# Patient Record
Sex: Male | Born: 1940 | Race: White | Hispanic: No | Marital: Married | Smoking: Former smoker
Health system: Southern US, Community
[De-identification: ages and names within clinical notes are randomized; demographics above are authoritative.]

## PROBLEM LIST (undated history)

## (undated) DIAGNOSIS — N189 Chronic kidney disease, unspecified: Secondary | ICD-10-CM

## (undated) DIAGNOSIS — I10 Essential (primary) hypertension: Secondary | ICD-10-CM

## (undated) DIAGNOSIS — E039 Hypothyroidism, unspecified: Secondary | ICD-10-CM

## (undated) DIAGNOSIS — M1990 Unspecified osteoarthritis, unspecified site: Secondary | ICD-10-CM

## (undated) DIAGNOSIS — E119 Type 2 diabetes mellitus without complications: Secondary | ICD-10-CM

## (undated) DIAGNOSIS — E785 Hyperlipidemia, unspecified: Secondary | ICD-10-CM

## (undated) DIAGNOSIS — K219 Gastro-esophageal reflux disease without esophagitis: Secondary | ICD-10-CM

## (undated) DIAGNOSIS — M1712 Unilateral primary osteoarthritis, left knee: Secondary | ICD-10-CM

## (undated) DIAGNOSIS — Z87442 Personal history of urinary calculi: Secondary | ICD-10-CM

## (undated) HISTORY — PX: COLONOSCOPY: SHX174

## (undated) HISTORY — PX: UPPER GI ENDOSCOPY: SHX6162

## (undated) HISTORY — PX: OTHER SURGICAL HISTORY: SHX169

## (undated) HISTORY — PX: JOINT REPLACEMENT: SHX530

---

## 2003-05-22 HISTORY — PX: HERNIA REPAIR: SHX51

## 2011-10-04 DIAGNOSIS — E039 Hypothyroidism, unspecified: Secondary | ICD-10-CM | POA: Insufficient documentation

## 2011-10-04 DIAGNOSIS — E119 Type 2 diabetes mellitus without complications: Secondary | ICD-10-CM | POA: Insufficient documentation

## 2011-10-04 DIAGNOSIS — I10 Essential (primary) hypertension: Secondary | ICD-10-CM | POA: Insufficient documentation

## 2011-10-04 DIAGNOSIS — N183 Chronic kidney disease, stage 3 unspecified: Secondary | ICD-10-CM | POA: Insufficient documentation

## 2013-02-23 ENCOUNTER — Ambulatory Visit: Payer: Self-pay | Admitting: Otolaryngology

## 2013-04-15 ENCOUNTER — Ambulatory Visit: Payer: Self-pay | Admitting: General Practice

## 2013-04-15 LAB — URINALYSIS, COMPLETE
Bacteria: NONE SEEN
Bilirubin,UR: NEGATIVE
Glucose,UR: 50 mg/dL (ref 0–75)
Hyaline Cast: 8
Ketone: NEGATIVE
Leukocyte Esterase: NEGATIVE
Nitrite: NEGATIVE
Ph: 6 (ref 4.5–8.0)
Protein: NEGATIVE
Squamous Epithelial: NONE SEEN
WBC UR: <1 /HPF (ref 0–5)

## 2013-04-15 LAB — CBC
HGB: 13.6 g/dL (ref 13.0–18.0)
WBC: 8.5 10*3/uL (ref 3.8–10.6)

## 2013-04-15 LAB — BASIC METABOLIC PANEL
Calcium, Total: 9.1 mg/dL (ref 8.5–10.1)
Co2: 32 mmol/L (ref 21–32)
Creatinine: 1.41 mg/dL — ABNORMAL HIGH (ref 0.60–1.30)
EGFR (African American): 57 — ABNORMAL LOW
Osmolality: 283 (ref 275–301)
Potassium: 4 mmol/L (ref 3.5–5.1)

## 2013-04-15 LAB — SEDIMENTATION RATE: Erythrocyte Sed Rate: 9 mm/hr (ref 0–20)

## 2013-04-15 LAB — PROTIME-INR: Prothrombin Time: 13.5 secs (ref 11.5–14.7)

## 2013-04-15 LAB — APTT: Activated PTT: 36 secs — ABNORMAL HIGH (ref 23.6–35.9)

## 2013-04-15 LAB — MRSA PCR SCREENING

## 2013-04-27 ENCOUNTER — Inpatient Hospital Stay: Payer: Self-pay | Admitting: General Practice

## 2013-04-27 HISTORY — PX: TOTAL KNEE ARTHROPLASTY: SHX125

## 2013-04-28 LAB — BASIC METABOLIC PANEL
Anion Gap: 2 — ABNORMAL LOW (ref 7–16)
BUN: 18 mg/dL (ref 7–18)
Co2: 28 mmol/L (ref 21–32)
Creatinine: 1.31 mg/dL — ABNORMAL HIGH (ref 0.60–1.30)
EGFR (Non-African Amer.): 54 — ABNORMAL LOW
Glucose: 120 mg/dL — ABNORMAL HIGH (ref 65–99)
Potassium: 3.9 mmol/L (ref 3.5–5.1)
Sodium: 135 mmol/L — ABNORMAL LOW (ref 136–145)

## 2013-04-28 LAB — HEMOGLOBIN: HGB: 11.8 g/dL — ABNORMAL LOW (ref 13.0–18.0)

## 2013-04-29 LAB — BASIC METABOLIC PANEL
Anion Gap: 3 — ABNORMAL LOW (ref 7–16)
BUN: 16 mg/dL (ref 7–18)
Calcium, Total: 8.5 mg/dL (ref 8.5–10.1)
Chloride: 104 mmol/L (ref 98–107)
Co2: 31 mmol/L (ref 21–32)
EGFR (African American): >60
Glucose: 120 mg/dL — ABNORMAL HIGH (ref 65–99)

## 2013-04-29 LAB — HEMOGLOBIN: HGB: 11.5 g/dL — ABNORMAL LOW (ref 13.0–18.0)

## 2013-04-29 LAB — PLATELET COUNT: Platelet: 135 10*3/uL — ABNORMAL LOW (ref 150–440)

## 2014-09-01 DIAGNOSIS — I150 Renovascular hypertension: Secondary | ICD-10-CM | POA: Insufficient documentation

## 2014-09-08 ENCOUNTER — Ambulatory Visit: Admit: 2014-09-08 | Disposition: A | Discharge status: Home / Self Care | Payer: Self-pay | Admitting: Internal Medicine

## 2014-09-10 NOTE — Discharge Summary (Signed)
PATIENT NAME:  Greg Lam, Greg Lam MR#:  [id number] DATE OF BIRTH:  Nov 08, 1940  DATE OF ADMISSION:  04/27/2013 DATE OF DISCHARGE: 04/29/2013   ADMITTING DIAGNOSIS: Degenerative arthrosis of the right knee.   DISCHARGE DIAGNOSIS: Degenerative arthrosis of the right knee.   HISTORY: The patient is a 74 year old, who has been followed at [facility] for progression of right knee pain. He reported a long history of progressive knee pain. He had been followed at [facility]  for several years and received Synvisc injections without any significant lasting improvement. He had not seen any improvement in his condition despite the use of ibuprofen. He denied any significant swelling, locking or giving way of the knee. At the time of surgery, he was having progressive difficulty with arising from a sitting position as well as ascending and descending stairs. He states that the pain had increased to the point that it was significantly interfering with his activities of daily living. X-rays taken in [place] showed significant narrowing of the medial cartilage space with associated varus alignment. He was noted to have subchondral sclerosis. After discussion of the risks and benefits of surgical intervention, the patient expressed his understanding of the risks and benefits and agreed for plans for surgical intervention.   PROCEDURE: Right total knee arthroplasty using computer-assisted navigation.   ANESTHESIA: Spinal.   SOFT TISSUE RELEASE: Anterior cruciate ligament, posterior cruciate ligament, deep medial collateral ligaments as well as the patellofemoral ligament.   IMPLANTS UTILIZED: DePuy Sigma size 5 posterior stabilized femoral component (cemented), size 5 MBT tibial component (cemented), 38 mm 3-peg oval dome patella (cemented), and a 10 mm stabilized rotating platform polyethylene insert. Gentamicin cement was utilized due to the patient's history of diabetes.   HOSPITAL  COURSE: The patient tolerated the procedure very well. He had no complications. He was then taken to the PACU where he was stabilized and then transferred to the orthopedic floor. He began receiving anticoagulation therapy of Lovenox 30 mg subcutaneous every 12 hours per anesthesia and pharmacy protocol. He was fitted with TED stockings bilaterally. These were allowed to be removed 1 hour per 8 hour shift. The right one was applied on day 2 following removal of the Hemovac and dressing change. The patient was also fitted with AVI compression foot pumps bilaterally set at 80 mmHg. His calves been nontender. There has been no evidence of any DVTs. Negative Homans sign.   The patient began receiving physical therapy on day 1 for gait training and transfers. Upon being discharged, he had been ambulating greater than 200 feet. He was able go up and down 4 sets of steps. He was independent with bed to chair transfers. Occupational therapy was also initiated on day 1 for ADLs and assistive devices. He has progressed very nicely. He has had no complications.   The patient's IV, Foley and Hemovac were discontinued on day 2along with a dressing change. Polar Care was reapplied to the surgical leg, maintaining a temperature of 40 to 50 degrees Fahrenheit.   The patient is being discharged to home in improved stable condition.   DISCHARGE INSTRUCTIONS: He may weight bear as tolerated. Continue using a walker until cleared by physical therapy to go to a quad cane. He will receive home health PT. Elevate the right lower extremity. Encouraged the patient to do heel pumps. Elevate heels off the bed. Encourage cough, deep breathing q.2 hours while awake. TED stockings are to be worn during day, but may be removed at night.  Continue using Polar Care pretty much around-the-clock for the first 2 weeks maintaining a temperature of 40 to 50 degrees Fahrenheit. He is placed on an ADA diet. He was instructed in wound care. He has a  followup appointment at [facility] on 12/23 at 9:00. He is to call the clinic sooner if any temperatures of 101.5 or greater or excessive bleeding. He is to resume his regular medications that he was on prior to admission. He was given a prescription for oxycodone 5 to 10 mg q.4 to 6 hours p.r.n. for pain as well as Tramadol 50 to 100 mg q.4 to 6 hours p.r.n. for pain and Lovenox 40 mg subcutaneously daily for 14 days, then discontinue and begin taking one 81 mg enteric-coated aspirin.   PAST MEDICAL HISTORY:  1.  Diabetes.  2.  Hiatal hernia.  3.  Hyperlipidemia.  4.  Hypertension.  5.  Hypothyroidism.  ____________________________ Vance Peper, PA jrw:aw D: 04/29/2013 13:58:23 ET T: 04/29/2013 14:18:03 ET JOB#: [id number]  cc: Vance Peper, PA, <Dictator> JON WOLFE PA ELECTRONICALLY SIGNED 05/05/2013 7:46

## 2014-09-10 NOTE — Op Note (Signed)
PATIENT NAME:  Greg Lam, Greg Lam MR#:  [id number] DATE OF BIRTH:  04-23-1941  DATE OF PROCEDURE:  04/27/2013  PREOPERATIVE DIAGNOSIS: Degenerative arthrosis of the right knee.   POSTOPERATIVE DIAGNOSIS: Degenerative arthrosis of the right knee.   PROCEDURE PERFORMED: Right total knee arthroplasty using computer-assisted navigation.   SURGEON: Skip Estimable, M.D.   ASSISTANT: Vance Peper, PA (required to maintain retraction throughout the procedure).   ANESTHESIA: Spinal.   ESTIMATED BLOOD LOSS: 50 mL.   FLUIDS REPLACED: 1200 mL of crystalloid.   TOURNIQUET TIME: 104 minutes.   DRAINS: Two medium drains to reinfusion system.   SOFT TISSUE RELEASES: Anterior cruciate ligament, posterior cruciate ligament, deep medial collateral ligament and patellofemoral ligament.   IMPLANTS UTILIZED: DePuy PFC Sigma size 5 posterior stabilized femoral component (cemented), size 5 mm MBT tibial component (cemented), 38 mm 3-peg oval dome patella (cemented), and a 10 mm stabilized rotating platform polyethylene insert. Gentamicin cement was utilized due to the patient's history of diabetes.   INDICATIONS FOR SURGERY: The patient is a 74 year old gentleman who has been seen for complaints of progressive right knee pain. X-rays demonstrated severe degenerative changes in tricompartmental fashion with varus deformity. After discussion of the risks and benefits of surgical intervention, the patient expressed understanding of the risks, benefits, and agreed with plans for surgical intervention.   PROCEDURE IN DETAIL: The patient was brought in the Operating Room and, after adequate spinal anesthesia was achieved, a tourniquet was placed on the patient's upper right thigh. The patient's right knee and leg were cleaned and prepped with alcohol and DuraPrep and draped in the usual sterile fashion. A "timeout" was performed as per usual protocol. The right lower extremity was exsanguinated using an Esmarch, and the  tourniquet was inflated to 300 mmHg. An anterior longitudinal incision was made followed by a standard mid vastus approach.   A small effusion was evacuated. The deep fibers of the medial collateral ligament were elevated in a subperiosteal fashion off the medial flare of the tibia so as to maintain a continuous soft tissue sleeve. The patella was subluxed laterally and the patellofemoral ligament was incised. Inspection of the knee demonstrated severe degenerative changes in tricompartmental fashion with full-thickness loss of articular cartilage to the medial compartment. Prominent osteophytes were debrided using a rongeur. Anterior and posterior cruciate ligaments were excised.   Two 4.0 mm Schanz pins were inserted in the femur and into the tibia for attachment of the array of trackers used for computer-assisted navigation. Hip center was identified using circumduction technique. Distal landmarks were mapped using the computer. The distal femur and proximal tibia were mapped using the computer. The distal femoral cutting guide was positioned using computer-assisted navigation so as to achieve a 5 degree distal valgus cut. Cut was performed and verified using the computer. The femoral sizing jig was positioned, and it was felt that a size 5 femoral component was appropriate. A size 5 cutting guide was positioned and the anterior cut was performed and verified using the computer. This was followed by completion of the posterior and chamfer cuts. Femoral cutting guide for the central box was then positioned and the central box cut was performed.   Attention was then directed to the proximal tibia. Medial and lateral menisci were excised. The extramedullary tibial cutting guide was positioned using computer-assisted navigation so as to achieve a 0 degree varus-valgus alignment and 0 degrees posterior slope. Cut was performed and verified using the computer. The proximal tibia was sized, and it was felt  that a  size 5 tibial tray was appropriate. Tibial and femoral trials were inserted, followed by insertion of a 10 mm polyethylene trial. Excellent mediolateral soft tissue balance was appreciated both in full extension and in flexion.   Finally, the patella was cut and prepared so as to accommodate a 38 mm 3-peg oval dome patella. Patellar trial was placed and the knee was placed through a range of motion with excellent patellar tracking appreciated. The femoral trial was removed after debridement of posterior osteophytes. Central post hole for the tibial component was reamed, followed by insertion of a keel punch. Tibial trial was then removed. The cut surfaces of bone were irrigated with copious amounts of normal saline solution using pulsatile lavage and then suctioned dry. Polymethyl methacrylate cement with gentamicin was prepared in the usual fashion, using a vacuum mixer. Cement was applied to the cut surface of the proximal tibia as well as along the undersurface of the size 5 MBT tibial component. The tip was positioned and impacted into place. Excess cement was removed using [employer] fast streamer.   Cement was then applied to the cut surface of the femur as well as the posterior flanges of the size 5 posterior stabilized femoral component. Femoral component was positioned and impacted into place. Excess cement was removed using [employer] fast streamer. The 10 mm polyethylene trial was inserted and the knee was brought into full extension using steady axial compression. Finally, cement was applied to the backside of a 38 mm 3-peg oval dome patella, and the patellar component was positioned and patellar clamp applied. Excess cement was removed using [employer] fast streamer.   After adequate curing of cement, the tourniquet was deflated after total tourniquet time of 104 minutes. Hemostasis was achieved using electrocautery. The knee was irrigated with copious amounts of normal saline with antibiotic solution, using pulsatile  lavage, and then suctioned dry. The knee was inspected for any residual cement debris. 20 mL of 1.3% Exparel in 40 mL of normal saline was then injected along the posterior capsule, medial and lateral gutters, and along the arthrotomy site.   A 10 mm stabilized rotating platform polyethylene insert was inserted and the knee was placed through a range of motion, with excellent patellar tracking appreciated and excellent mediolateral soft tissue balancing noted. Two medium drains were placed and brought out through a separate stab incision to be attached to a reinfusion system. The medial parapatellar portion of the incision was reapproximated using interrupted sutures of #1 Vicryl. The subcutaneous tissue was then injected along the incision site with total of 30 mL of 0.25% Marcaine with epinephrine. The subcutaneous tissue was approximated in layers using first #0 Vicryl followed by 2-0 Vicryl. Skin was closed with skin staples. A sterile dressing was applied. The patient tolerated the procedure well. He was transported to the recovery room in stable condition.  ____________________________ Laurice Record. Holley Bouche., MD jph:cg D: 04/28/2013 00:12:41 ET T: 04/28/2013 00:27:37 ET JOB#: [id number]  cc: Laurice Record. Holley Bouche., MD, <Dictator> JAMES P Holley Bouche MD ELECTRONICALLY SIGNED 04/28/2013 22:40

## 2014-09-14 DIAGNOSIS — H903 Sensorineural hearing loss, bilateral: Secondary | ICD-10-CM | POA: Insufficient documentation

## 2014-12-08 ENCOUNTER — Other Ambulatory Visit: Payer: Self-pay | Admitting: Internal Medicine

## 2014-12-08 ENCOUNTER — Other Ambulatory Visit: Payer: Self-pay | Admitting: Ophthalmology

## 2014-12-08 ENCOUNTER — Ambulatory Visit
Admission: RE | Admit: 2014-12-08 | Discharge: 2014-12-08 | Disposition: A | Discharge status: Home / Self Care | Payer: Medicare Other | Source: Ambulatory Visit | Attending: Ophthalmology | Admitting: Ophthalmology

## 2014-12-08 DIAGNOSIS — G459 Transient cerebral ischemic attack, unspecified: Secondary | ICD-10-CM

## 2014-12-08 DIAGNOSIS — H34211 Partial retinal artery occlusion, right eye: Secondary | ICD-10-CM

## 2014-12-08 DIAGNOSIS — I6523 Occlusion and stenosis of bilateral carotid arteries: Secondary | ICD-10-CM | POA: Insufficient documentation

## 2014-12-10 ENCOUNTER — Ambulatory Visit: Payer: Self-pay

## 2015-05-11 DIAGNOSIS — M4810 Ankylosing hyperostosis [Forestier], site unspecified: Secondary | ICD-10-CM | POA: Insufficient documentation

## 2015-12-20 IMAGING — US US CAROTID DUPLEX BILAT
1 series · 13 of 24 positions shown · non-contrast
Comparison: None.

CLINICAL DATA: Hollenhorst plaque, right eye; TIA. Hypertension,
diabetes.

EXAM:
BILATERAL CAROTID DUPLEX ULTRASOUND
TECHNIQUE: Gray scale imaging, color Doppler and duplex ultrasound was
performed of bilateral carotid and vertebral arteries in the neck.

[Series 1: us carotid duplex bilat · 0.08mm/px · 13 of 51 slices shown]
[im 1/51]
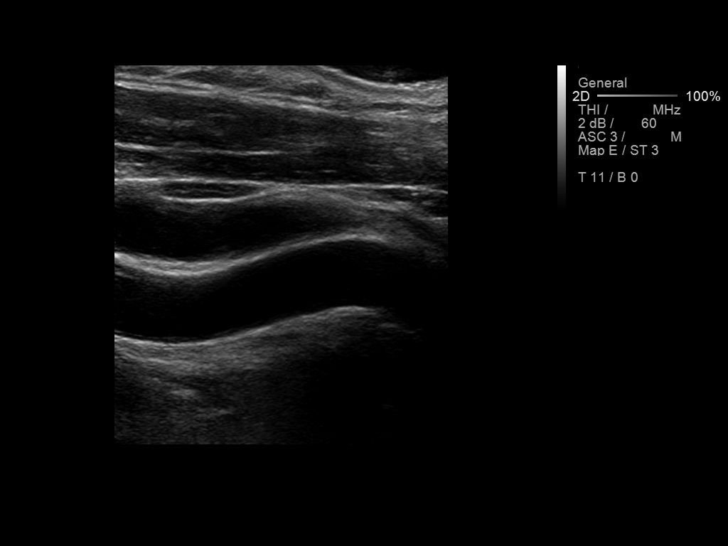
[im 5/51]
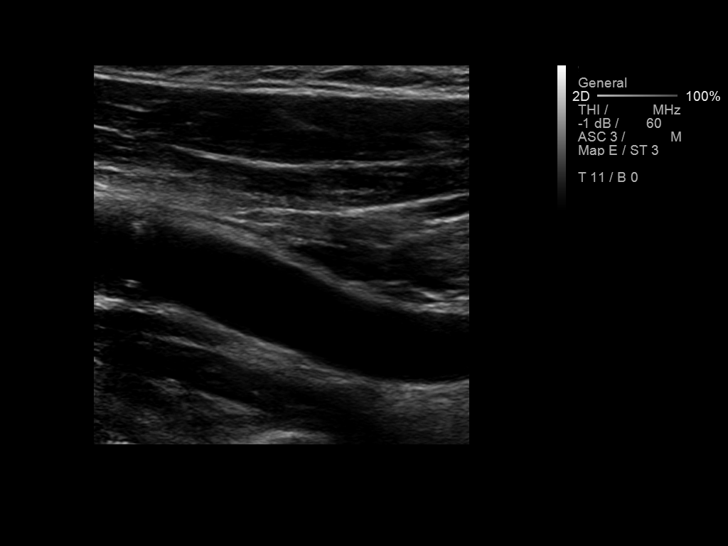
[im 9/51]
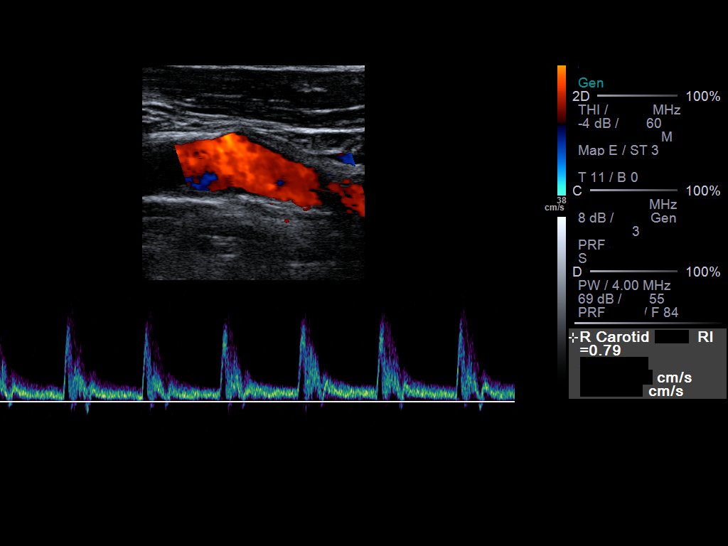
[im 14/51]
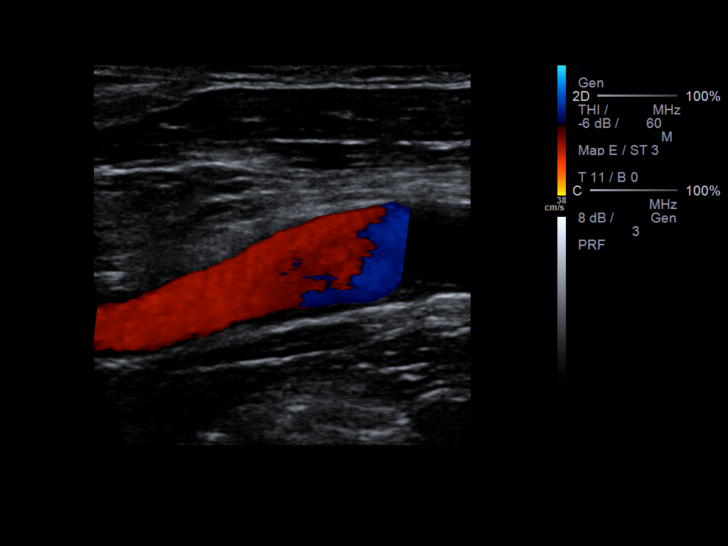
[im 18/51]
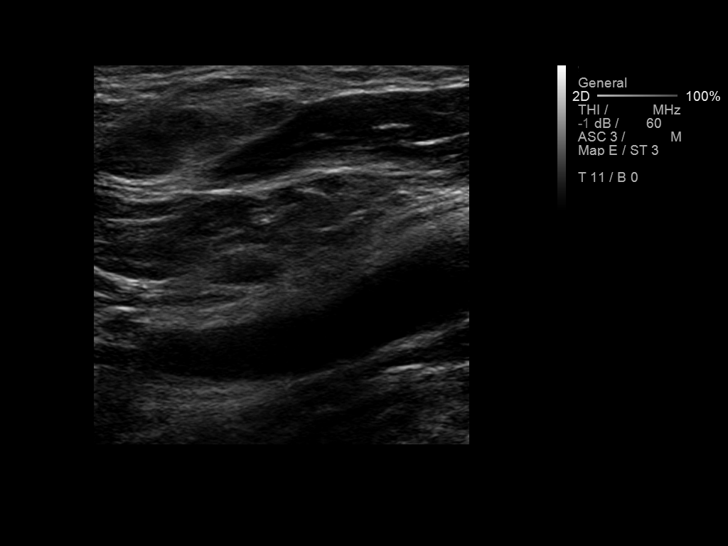
[im 22/51]
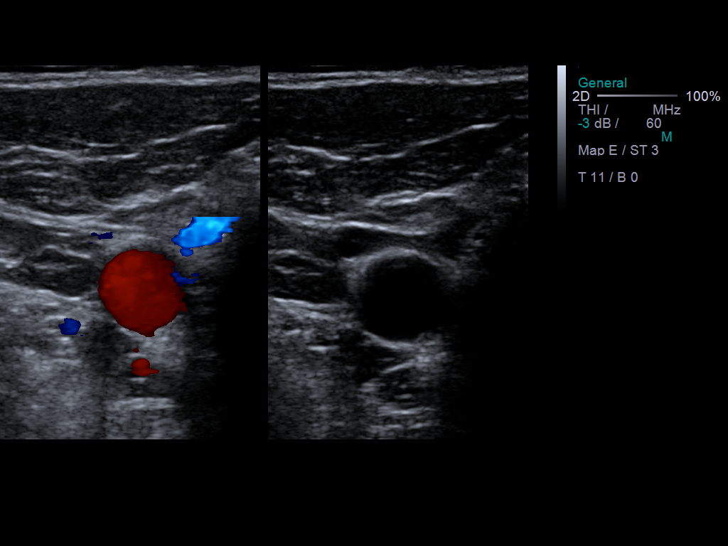
[im 27/51]
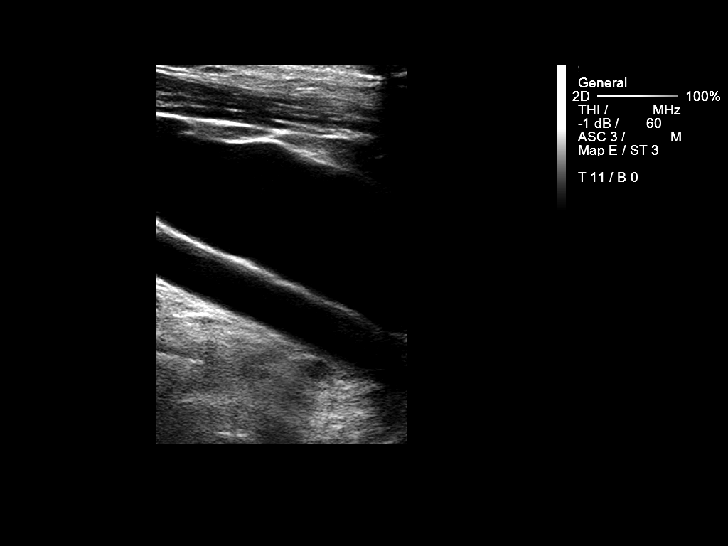
[im 29/51]
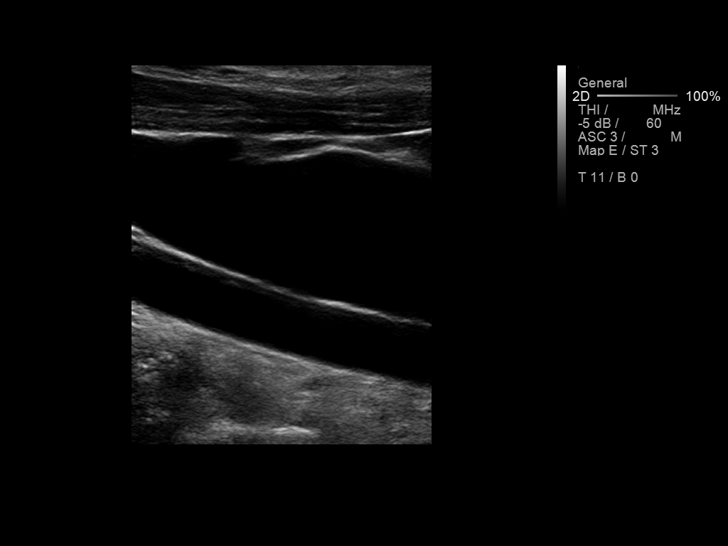
[im 33/51]
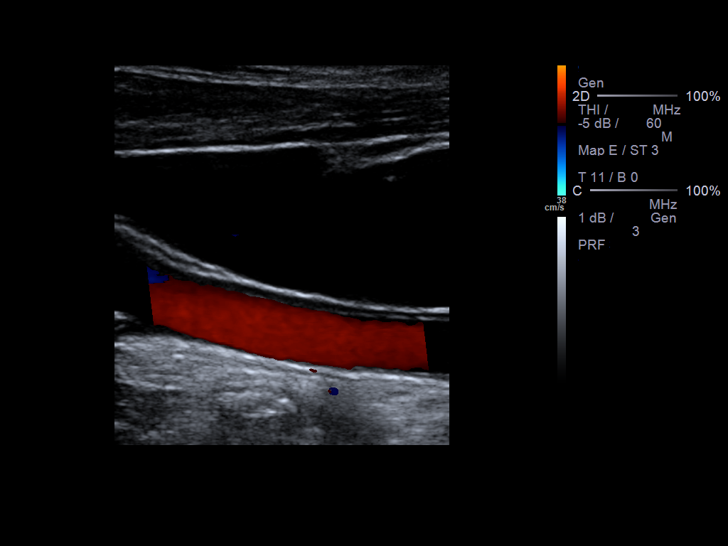
[im 37/51]
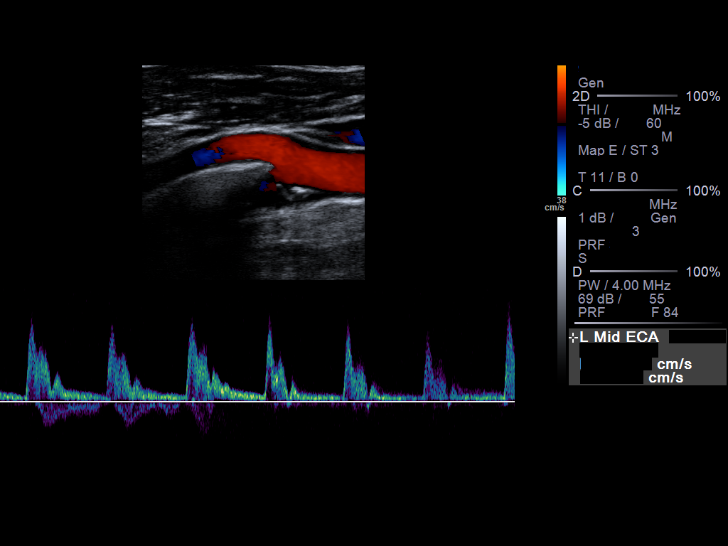
[im 42/51]
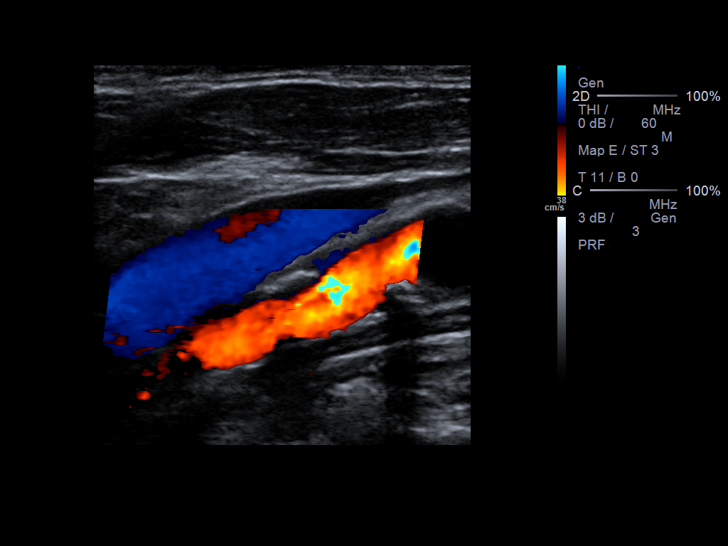
[im 46/51]
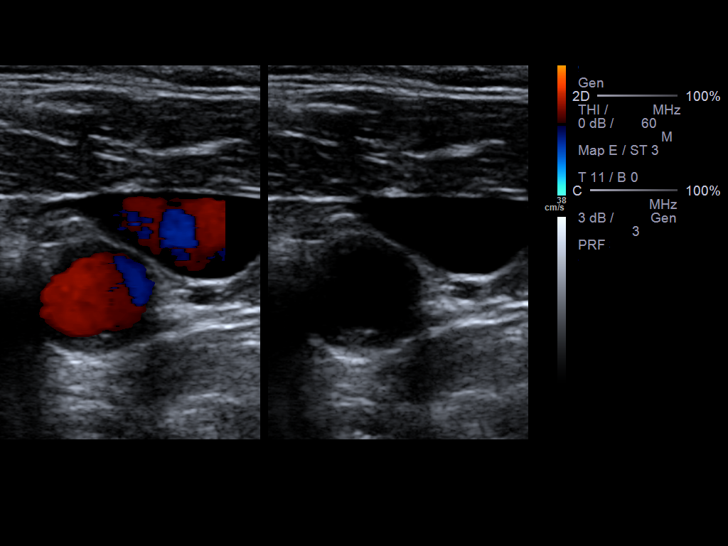
[im 51/51]
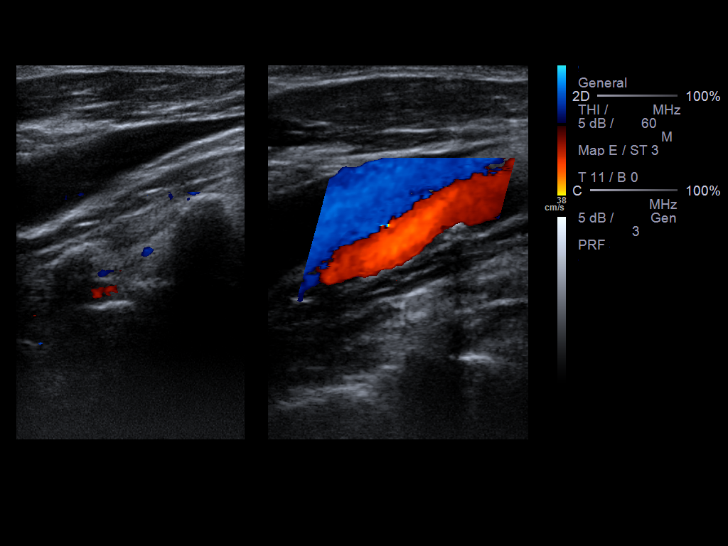

[13 of 24 positions shown; findings below may reference images not displayed]

REVIEW OF SYSTEMS:
Quantification of carotid stenosis is based on velocity parameters
that correlate the residual internal carotid diameter with
NASCET-based stenosis levels, using the diameter of the distal
internal carotid lumen as the denominator for stenosis measurement.

The following velocity measurements were obtained:

PEAK SYSTOLIC/END DIASTOLIC

RIGHT

ICA:                     82/18cm/sec

CCA:                     139/20cm/sec

SYSTOLIC ICA/CCA RATIO:

DIASTOLIC ICA/CCA RATIO:

ECA:                     142cm/sec

LEFT

ICA:                     156/19cm/sec

CCA:                     119/26cm/sec

SYSTOLIC ICA/CCA RATIO:

DIASTOLIC ICA/CCA RATIO:

ECA:                     125cm/sec
FINDINGS: RIGHT CAROTID ARTERY: Scattered mild plaque in the bulb, proximal
internal and external carotid arteries. No high-grade stenosis.
Normal waveforms and color Doppler signal.

RIGHT VERTEBRAL ARTERY:  Normal flow direction and waveform.

LEFT CAROTID ARTERY: Eccentric partially calcified plaque in the
carotid bulb and in the proximal ICA resulting in at least mild
stenosis. Normal waveforms and color Doppler signal.

LEFT VERTEBRAL ARTERY: Normal flow direction and waveform.
IMPRESSION: 1. Bilateral carotid bifurcation and proximal ICA plaque, left
greater than right, resulting in less than 50% diameter stenosis.
The exam does not exclude plaque ulceration or embolization.
Continued surveillance recommended.

## 2016-09-11 DIAGNOSIS — E538 Deficiency of other specified B group vitamins: Secondary | ICD-10-CM | POA: Insufficient documentation

## 2016-09-11 DIAGNOSIS — E782 Mixed hyperlipidemia: Secondary | ICD-10-CM | POA: Insufficient documentation

## 2018-02-23 NOTE — Discharge Instructions (Signed)
°  Instructions after Total Knee Replacement ° ° Greg Lam, [name], M.D.    ° Dept. of Orthopaedics & Sports Medicine ° [facility] ° [address] ° [place], [place]  [address] ° Phone: [phone]   Fax: [phone] ° °  °DIET: °• Drink plenty of non-alcoholic fluids. °• Resume your normal diet. Include foods high in fiber. ° °ACTIVITY:  °• You may use crutches or a walker with weight-bearing as tolerated, unless instructed otherwise. °• You may be weaned off of the walker or crutches by your Physical Therapist.  °• Do NOT place pillows under the knee. Anything placed under the knee could limit your ability to straighten the knee.   °• Continue doing gentle exercises. Exercising will reduce the pain and swelling, increase motion, and prevent muscle weakness.   °• Please continue to use the TED compression stockings for 6 weeks. You may remove the stockings at night, but should reapply them in the morning. °• Do not drive or operate any equipment until instructed. ° °WOUND CARE:  °• Continue to use the PolarCare or ice packs periodically to reduce pain and swelling. °• You may bathe or shower after the staples are removed at the first office visit following surgery. ° °MEDICATIONS: °• You may resume your regular medications. °• Please take the pain medication as prescribed on the medication. °• Do not take pain medication on an empty stomach. °• You have been given a prescription for a blood thinner (Lovenox or Coumadin). Please take the medication as instructed. (NOTE: After completing a 2 week course of Lovenox, take one Enteric-coated aspirin once a day. This along with elevation will help reduce the possibility of phlebitis in your operated leg.) °• Do not drive or drink alcoholic beverages when taking pain medications. ° °CALL THE OFFICE FOR: °• Temperature above 101 degrees °• Excessive bleeding or drainage on the dressing. °• Excessive swelling, coldness, or paleness of the toes. °• Persistent  nausea and vomiting. ° °FOLLOW-UP:  °• You should have an appointment to return to the office in 10-14 days after surgery. °• Arrangements have been made for continuation of Physical Therapy (either home therapy or outpatient therapy). °  °

## 2018-02-26 ENCOUNTER — Encounter
Admission: RE | Admit: 2018-02-26 | Discharge: 2018-02-26 | Disposition: A | Discharge status: Home / Self Care | Payer: Medicare Other | Source: Ambulatory Visit | Attending: Orthopedic Surgery | Admitting: Orthopedic Surgery

## 2018-02-26 ENCOUNTER — Other Ambulatory Visit: Payer: Self-pay

## 2018-02-26 DIAGNOSIS — Z01818 Encounter for other preprocedural examination: Secondary | ICD-10-CM | POA: Diagnosis present

## 2018-02-26 DIAGNOSIS — E119 Type 2 diabetes mellitus without complications: Secondary | ICD-10-CM | POA: Insufficient documentation

## 2018-02-26 DIAGNOSIS — I10 Essential (primary) hypertension: Secondary | ICD-10-CM | POA: Insufficient documentation

## 2018-02-26 HISTORY — DX: Hyperlipidemia, unspecified: E78.5

## 2018-02-26 HISTORY — DX: Personal history of urinary calculi: Z87.442

## 2018-02-26 HISTORY — DX: Unilateral primary osteoarthritis, left knee: M17.12

## 2018-02-26 HISTORY — DX: Essential (primary) hypertension: I10

## 2018-02-26 HISTORY — DX: Type 2 diabetes mellitus without complications: E11.9

## 2018-02-26 HISTORY — DX: Hypothyroidism, unspecified: E03.9

## 2018-02-26 HISTORY — DX: Gastro-esophageal reflux disease without esophagitis: K21.9

## 2018-02-26 HISTORY — DX: Chronic kidney disease, unspecified: N18.9

## 2018-02-26 HISTORY — DX: Unspecified osteoarthritis, unspecified site: M19.90

## 2018-02-26 LAB — COMPREHENSIVE METABOLIC PANEL
ALK PHOS: 46 U/L (ref 38–126)
ALT: 19 U/L (ref 0–44)
ANION GAP: 10 (ref 5–15)
AST: 28 U/L (ref 15–41)
Albumin: 4.7 g/dL (ref 3.5–5.0)
BILIRUBIN TOTAL: 0.7 mg/dL (ref 0.3–1.2)
BUN: 25 mg/dL — ABNORMAL HIGH (ref 8–23)
CO2: 27 mmol/L (ref 22–32)
Calcium: 9.5 mg/dL (ref 8.9–10.3)
Chloride: 103 mmol/L (ref 98–111)
Creatinine, Ser: 1.69 mg/dL — ABNORMAL HIGH (ref 0.61–1.24)
GFR, EST AFRICAN AMERICAN: 43 mL/min — AB (ref >60)
GFR, EST NON AFRICAN AMERICAN: 37 mL/min — AB (ref >60)
Glucose, Bld: 136 mg/dL — ABNORMAL HIGH (ref 70–99)
POTASSIUM: 4.1 mmol/L (ref 3.5–5.1)
Sodium: 140 mmol/L (ref 135–145)
TOTAL PROTEIN: 7.8 g/dL (ref 6.5–8.1)

## 2018-02-26 LAB — URINALYSIS, ROUTINE W REFLEX MICROSCOPIC
Bacteria, UA: NONE SEEN
Bilirubin Urine: NEGATIVE
GLUCOSE, UA: NEGATIVE mg/dL
KETONES UR: NEGATIVE mg/dL
Leukocytes, UA: NEGATIVE
Nitrite: NEGATIVE
PH: 6 (ref 5.0–8.0)
Protein, ur: NEGATIVE mg/dL
Specific Gravity, Urine: 1.017 (ref 1.005–1.030)
Squamous Epithelial / LPF: NONE SEEN (ref 0–5)
WBC UA: NONE SEEN WBC/hpf (ref 0–5)

## 2018-02-26 LAB — APTT: aPTT: 38 seconds — ABNORMAL HIGH (ref 24–36)

## 2018-02-26 LAB — CBC
HEMATOCRIT: 40.2 % (ref 39.0–52.0)
HEMOGLOBIN: 13.3 g/dL (ref 13.0–17.0)
MCH: 31.9 pg (ref 26.0–34.0)
MCHC: 33.1 g/dL (ref 30.0–36.0)
MCV: 96.4 fL (ref 80.0–100.0)
Platelets: 157 10*3/uL (ref 150–400)
RBC: 4.17 MIL/uL — ABNORMAL LOW (ref 4.22–5.81)
RDW: 12.8 % (ref 11.5–15.5)
WBC: 6.7 10*3/uL (ref 4.0–10.5)
nRBC: 0 % (ref 0.0–0.2)

## 2018-02-26 LAB — TYPE AND SCREEN
ABO/RH(D): O POS
ANTIBODY SCREEN: NEGATIVE

## 2018-02-26 LAB — PROTIME-INR
INR: 1.11
PROTHROMBIN TIME: 14.2 s (ref 11.4–15.2)

## 2018-02-26 LAB — C-REACTIVE PROTEIN: CRP: <0.8 mg/dL (ref <1.0)

## 2018-02-26 LAB — SURGICAL PCR SCREEN
MRSA, PCR: NEGATIVE
Staphylococcus aureus: NEGATIVE

## 2018-02-26 LAB — SEDIMENTATION RATE: SED RATE: 19 mm/h (ref 0–20)

## 2018-02-26 NOTE — Patient Instructions (Signed)
Your procedure is scheduled on:  Wednesday, March 12, 2018 Report to Day Surgery on the 2nd floor of the [facility]. To find out your arrival time, please call [phone] between 1PM - 3PM on: Tuesday, March 11, 2018  REMEMBER: Instructions that are not followed completely may result in serious medical risk, up to and including death; or upon the discretion of your surgeon and anesthesiologist your surgery may need to be rescheduled.  Do not eat food after midnight the night before surgery.  No gum chewing, lozengers or hard candies.  You may however, drink water up to 2 hours before you are scheduled to arrive for your surgery. Do not drink anything within 2 hours of the start of your surgery.  No Alcohol for 24 hours before or after surgery.  No Smoking including e-cigarettes for 24 hours prior to surgery.  No chewable tobacco products for at least 6 hours prior to surgery.  No nicotine patches on the day of surgery.  On the morning of surgery brush your teeth with toothpaste and water, you may rinse your mouth with mouthwash if you wish. Do not swallow any toothpaste or mouthwash.  Notify your doctor if there is any change in your medical condition (cold, fever, infection).  Do not wear jewelry, make-up, hairpins, clips or nail polish.  Do not wear lotions, powders, or perfumes. You may wear deodorant.  Do not shave 48 hours prior to surgery. Men may shave face and neck.  Contacts and dentures may not be worn into surgery.  Do not bring valuables to the hospital, including drivers license, insurance or credit cards.  [facility] is not responsible for any belongings or valuables.   TAKE THESE MEDICATIONS THE MORNING OF SURGERY:  1.  Atenolol 2.  Levothyroxine 3.  Ranitidine 4.  Tramadol (if needed for pain)  Use CHG Soap as directed on instruction sheet.  On October 16 - Stop Anti-inflammatories (NSAIDS) such as Advil, Aleve, Ibuprofen, Motrin, Naproxen,  Naprosyn and Aspirin based products such as Excedrin, Goodys Powder, BC Powder. (May take Tylenol or Acetaminophen if needed.)  On October 16 - Stop ANY OVER THE COUNTER supplements until after surgery. (May continue Vitamin D, Vitamin B, and multivitamin.)  Wear comfortable clothing (specific to your surgery type) to the hospital.  If you are being admitted to the hospital overnight, leave your suitcase in the car. After surgery it may be brought to your room.  Please call [phone] if you have any questions about these instructions.

## 2018-02-27 ENCOUNTER — Other Ambulatory Visit: Payer: Medicare Other

## 2018-02-27 LAB — URINE CULTURE
CULTURE: NO GROWTH
Special Requests: NORMAL

## 2018-03-11 MED ORDER — CEFAZOLIN SODIUM-DEXTROSE 2-4 GM/100ML-% IV SOLN: 2.0000 g | INTRAVENOUS | Status: DC

## 2018-03-11 MED ORDER — TRANEXAMIC ACID-NACL 1000-0.7 MG/100ML-% IV SOLN
1000.0000 mg | INTRAVENOUS | Status: DC
  Filled 2018-03-11: qty 100

## 2018-03-12 ENCOUNTER — Encounter: Payer: Self-pay | Admitting: Orthopedic Surgery

## 2018-03-12 ENCOUNTER — Inpatient Hospital Stay: Payer: Medicare Other | Admitting: Anesthesiology

## 2018-03-12 ENCOUNTER — Other Ambulatory Visit: Payer: Self-pay

## 2018-03-12 ENCOUNTER — Encounter
Admission: RE | Disposition: A | Discharge status: Home Health | Payer: Self-pay | Source: Home / Self Care | Attending: Orthopedic Surgery

## 2018-03-12 ENCOUNTER — Inpatient Hospital Stay: Payer: Medicare Other

## 2018-03-12 ENCOUNTER — Inpatient Hospital Stay
Admission: RE | Admit: 2018-03-12 | Discharge: 2018-03-14 | DRG: 470 | Disposition: A | Discharge status: Home Health | Payer: Medicare Other | Attending: Orthopedic Surgery | Admitting: Orthopedic Surgery

## 2018-03-12 DIAGNOSIS — Z7984 Long term (current) use of oral hypoglycemic drugs: Secondary | ICD-10-CM

## 2018-03-12 DIAGNOSIS — K219 Gastro-esophageal reflux disease without esophagitis: Secondary | ICD-10-CM | POA: Diagnosis present

## 2018-03-12 DIAGNOSIS — N183 Chronic kidney disease, stage 3 (moderate): Secondary | ICD-10-CM | POA: Diagnosis present

## 2018-03-12 DIAGNOSIS — E039 Hypothyroidism, unspecified: Secondary | ICD-10-CM | POA: Diagnosis present

## 2018-03-12 DIAGNOSIS — M1712 Unilateral primary osteoarthritis, left knee: Secondary | ICD-10-CM | POA: Diagnosis present

## 2018-03-12 DIAGNOSIS — Z79890 Hormone replacement therapy (postmenopausal): Secondary | ICD-10-CM | POA: Diagnosis not present

## 2018-03-12 DIAGNOSIS — E782 Mixed hyperlipidemia: Secondary | ICD-10-CM | POA: Diagnosis present

## 2018-03-12 DIAGNOSIS — M79662 Pain in left lower leg: Secondary | ICD-10-CM

## 2018-03-12 DIAGNOSIS — E1122 Type 2 diabetes mellitus with diabetic chronic kidney disease: Secondary | ICD-10-CM | POA: Diagnosis present

## 2018-03-12 DIAGNOSIS — Z23 Encounter for immunization: Secondary | ICD-10-CM

## 2018-03-12 DIAGNOSIS — Z885 Allergy status to narcotic agent status: Secondary | ICD-10-CM | POA: Diagnosis not present

## 2018-03-12 DIAGNOSIS — Z87891 Personal history of nicotine dependence: Secondary | ICD-10-CM

## 2018-03-12 DIAGNOSIS — M25762 Osteophyte, left knee: Secondary | ICD-10-CM | POA: Diagnosis present

## 2018-03-12 DIAGNOSIS — Z888 Allergy status to other drugs, medicaments and biological substances status: Secondary | ICD-10-CM | POA: Diagnosis not present

## 2018-03-12 DIAGNOSIS — I129 Hypertensive chronic kidney disease with stage 1 through stage 4 chronic kidney disease, or unspecified chronic kidney disease: Secondary | ICD-10-CM | POA: Diagnosis present

## 2018-03-12 DIAGNOSIS — I82409 Acute embolism and thrombosis of unspecified deep veins of unspecified lower extremity: Secondary | ICD-10-CM

## 2018-03-12 DIAGNOSIS — Z96659 Presence of unspecified artificial knee joint: Secondary | ICD-10-CM

## 2018-03-12 HISTORY — PX: KNEE ARTHROPLASTY: SHX992

## 2018-03-12 LAB — GLUCOSE, CAPILLARY
GLUCOSE-CAPILLARY: 128 mg/dL — AB (ref 70–99)
GLUCOSE-CAPILLARY: 169 mg/dL — AB (ref 70–99)
Glucose-Capillary: 164 mg/dL — ABNORMAL HIGH (ref 70–99)
Glucose-Capillary: 206 mg/dL — ABNORMAL HIGH (ref 70–99)

## 2018-03-12 LAB — ABO/RH: ABO/RH(D): O POS

## 2018-03-12 SURGERY — ARTHROPLASTY, KNEE, TOTAL, USING IMAGELESS COMPUTER-ASSISTED NAVIGATION
Anesthesia: General | Site: Knee | Laterality: Left

## 2018-03-12 MED ORDER — BUPIVACAINE HCL (PF) 0.25 % IJ SOLN
INTRAMUSCULAR | Status: AC
  Filled 2018-03-12: qty 60

## 2018-03-12 MED ORDER — OXYCODONE HCL 5 MG PO TABS: 10.0000 mg | ORAL_TABLET | ORAL | Status: DC | PRN

## 2018-03-12 MED ORDER — ENOXAPARIN SODIUM 30 MG/0.3ML ~~LOC~~ SOLN
30.0000 mg | Freq: Two times a day (BID) | SUBCUTANEOUS | Status: DC
  Administered 2018-03-13 – 2018-03-14 (×3): 30 mg via SUBCUTANEOUS
  Filled 2018-03-12 (×3): qty 0.3

## 2018-03-12 MED ORDER — PROPOFOL 500 MG/50ML IV EMUL
INTRAVENOUS | Status: AC
  Filled 2018-03-12: qty 50

## 2018-03-12 MED ORDER — HYDROMORPHONE HCL 1 MG/ML IJ SOLN: 0.5000 mg | INTRAMUSCULAR | Status: DC | PRN

## 2018-03-12 MED ORDER — FERROUS SULFATE 325 (65 FE) MG PO TABS
325.0000 mg | ORAL_TABLET | Freq: Two times a day (BID) | ORAL | Status: DC
  Administered 2018-03-12 – 2018-03-14 (×4): 325 mg via ORAL
  Filled 2018-03-12 (×4): qty 1

## 2018-03-12 MED ORDER — DIPHENHYDRAMINE HCL 12.5 MG/5ML PO ELIX: 12.5000 mg | ORAL_SOLUTION | ORAL | Status: DC | PRN

## 2018-03-12 MED ORDER — SODIUM CHLORIDE 0.9 % IV SOLN
INTRAVENOUS | Status: DC | PRN
  Administered 2018-03-12: 60 mL

## 2018-03-12 MED ORDER — FENTANYL CITRATE (PF) 100 MCG/2ML IJ SOLN
INTRAMUSCULAR | Status: AC
  Filled 2018-03-12: qty 2

## 2018-03-12 MED ORDER — PHENOL 1.4 % MT LIQD
1.0000 | OROMUCOSAL | Status: DC | PRN
  Filled 2018-03-12: qty 177

## 2018-03-12 MED ORDER — CEFAZOLIN SODIUM-DEXTROSE 2-4 GM/100ML-% IV SOLN
INTRAVENOUS | Status: AC
  Filled 2018-03-12: qty 100

## 2018-03-12 MED ORDER — BUPIVACAINE HCL (PF) 0.5 % IJ SOLN
INTRAMUSCULAR | Status: DC | PRN
  Administered 2018-03-12: 2.6 mL

## 2018-03-12 MED ORDER — DEXAMETHASONE SODIUM PHOSPHATE 10 MG/ML IJ SOLN
8.0000 mg | Freq: Once | INTRAMUSCULAR | Status: AC
  Administered 2018-03-12: 8 mg via INTRAVENOUS

## 2018-03-12 MED ORDER — CELECOXIB 200 MG PO CAPS
200.0000 mg | ORAL_CAPSULE | Freq: Two times a day (BID) | ORAL | Status: DC
  Administered 2018-03-12 – 2018-03-14 (×4): 200 mg via ORAL
  Filled 2018-03-12 (×4): qty 1

## 2018-03-12 MED ORDER — LIDOCAINE HCL (CARDIAC) PF 100 MG/5ML IV SOSY
PREFILLED_SYRINGE | INTRAVENOUS | Status: DC | PRN
  Administered 2018-03-12: 100 mg via INTRAVENOUS

## 2018-03-12 MED ORDER — MAGNESIUM HYDROXIDE 400 MG/5ML PO SUSP
30.0000 mL | Freq: Every day | ORAL | Status: DC
  Administered 2018-03-12 – 2018-03-13 (×2): 30 mL via ORAL
  Filled 2018-03-12 (×2): qty 30

## 2018-03-12 MED ORDER — FLEET ENEMA 7-19 GM/118ML RE ENEM: 1.0000 | ENEMA | Freq: Once | RECTAL | Status: DC | PRN

## 2018-03-12 MED ORDER — GLIMEPIRIDE 2 MG PO TABS
2.0000 mg | ORAL_TABLET | Freq: Every morning | ORAL | Status: DC
  Administered 2018-03-13 – 2018-03-14 (×2): 2 mg via ORAL
  Filled 2018-03-12 (×2): qty 1

## 2018-03-12 MED ORDER — LEVOTHYROXINE SODIUM 50 MCG PO TABS
175.0000 ug | ORAL_TABLET | Freq: Every day | ORAL | Status: DC
  Administered 2018-03-13 – 2018-03-14 (×2): 175 ug via ORAL
  Filled 2018-03-12 (×2): qty 1

## 2018-03-12 MED ORDER — LOSARTAN POTASSIUM-HCTZ 100-25 MG PO TABS: 1.0000 | ORAL_TABLET | Freq: Every day | ORAL | Status: DC

## 2018-03-12 MED ORDER — SODIUM CHLORIDE 0.9 % IV SOLN
INTRAVENOUS | Status: DC
  Administered 2018-03-12 (×2): via INTRAVENOUS

## 2018-03-12 MED ORDER — GABAPENTIN 300 MG PO CAPS
300.0000 mg | ORAL_CAPSULE | Freq: Once | ORAL | Status: AC
  Administered 2018-03-12: 300 mg via ORAL

## 2018-03-12 MED ORDER — BISACODYL 10 MG RE SUPP
10.0000 mg | Freq: Every day | RECTAL | Status: DC | PRN
  Administered 2018-03-13: 10 mg via RECTAL
  Filled 2018-03-12: qty 1

## 2018-03-12 MED ORDER — METOCLOPRAMIDE HCL 5 MG/ML IJ SOLN: 5.0000 mg | Freq: Three times a day (TID) | INTRAMUSCULAR | Status: DC | PRN

## 2018-03-12 MED ORDER — LOSARTAN POTASSIUM 50 MG PO TABS
100.0000 mg | ORAL_TABLET | Freq: Every day | ORAL | Status: DC
  Administered 2018-03-12 – 2018-03-14 (×2): 100 mg via ORAL
  Filled 2018-03-12 (×4): qty 2

## 2018-03-12 MED ORDER — BUPIVACAINE HCL (PF) 0.5 % IJ SOLN
INTRAMUSCULAR | Status: AC
  Filled 2018-03-12: qty 10

## 2018-03-12 MED ORDER — TRANEXAMIC ACID-NACL 1000-0.7 MG/100ML-% IV SOLN
1000.0000 mg | Freq: Once | INTRAVENOUS | Status: AC
  Administered 2018-03-12: 1000 mg via INTRAVENOUS
  Filled 2018-03-12: qty 100

## 2018-03-12 MED ORDER — CELECOXIB 200 MG PO CAPS
ORAL_CAPSULE | ORAL | Status: AC
  Filled 2018-03-12: qty 2

## 2018-03-12 MED ORDER — SODIUM CHLORIDE 0.9 % IV SOLN
INTRAVENOUS | Status: DC
  Administered 2018-03-12 – 2018-03-13 (×2): via INTRAVENOUS

## 2018-03-12 MED ORDER — SODIUM CHLORIDE 0.9 % IV SOLN
INTRAVENOUS | Status: DC | PRN
  Administered 2018-03-12: 30 ug/min via INTRAVENOUS

## 2018-03-12 MED ORDER — TRANEXAMIC ACID-NACL 1000-0.7 MG/100ML-% IV SOLN
INTRAVENOUS | Status: DC | PRN
  Administered 2018-03-12: 1000 mg via INTRAVENOUS

## 2018-03-12 MED ORDER — NEOMYCIN-POLYMYXIN B GU 40-200000 IR SOLN
Status: DC | PRN
  Administered 2018-03-12: 14 mL

## 2018-03-12 MED ORDER — SODIUM CHLORIDE FLUSH 0.9 % IV SOLN
INTRAVENOUS | Status: AC
  Filled 2018-03-12: qty 40

## 2018-03-12 MED ORDER — GABAPENTIN 300 MG PO CAPS
300.0000 mg | ORAL_CAPSULE | Freq: Every day | ORAL | Status: DC
  Administered 2018-03-12 – 2018-03-13 (×2): 300 mg via ORAL
  Filled 2018-03-12 (×2): qty 1

## 2018-03-12 MED ORDER — GLIMEPIRIDE 1 MG PO TABS
1.0000 mg | ORAL_TABLET | Freq: Every day | ORAL | Status: DC
  Administered 2018-03-12 – 2018-03-13 (×2): 1 mg via ORAL
  Filled 2018-03-12 (×3): qty 1

## 2018-03-12 MED ORDER — FENTANYL CITRATE (PF) 100 MCG/2ML IJ SOLN: 25.0000 ug | INTRAMUSCULAR | Status: DC | PRN

## 2018-03-12 MED ORDER — BUPIVACAINE LIPOSOME 1.3 % IJ SUSP
INTRAMUSCULAR | Status: AC
  Filled 2018-03-12: qty 20

## 2018-03-12 MED ORDER — METOCLOPRAMIDE HCL 10 MG PO TABS: 5.0000 mg | ORAL_TABLET | Freq: Three times a day (TID) | ORAL | Status: DC | PRN

## 2018-03-12 MED ORDER — CELECOXIB 200 MG PO CAPS
400.0000 mg | ORAL_CAPSULE | Freq: Once | ORAL | Status: AC
  Administered 2018-03-12: 400 mg via ORAL

## 2018-03-12 MED ORDER — OXYCODONE HCL 5 MG PO TABS
5.0000 mg | ORAL_TABLET | ORAL | Status: DC | PRN
  Administered 2018-03-12: 5 mg via ORAL
  Filled 2018-03-12: qty 1

## 2018-03-12 MED ORDER — MIDAZOLAM HCL 5 MG/5ML IJ SOLN
INTRAMUSCULAR | Status: DC | PRN
  Administered 2018-03-12: 1 mg via INTRAVENOUS

## 2018-03-12 MED ORDER — ONDANSETRON HCL 4 MG/2ML IJ SOLN: 4.0000 mg | Freq: Four times a day (QID) | INTRAMUSCULAR | Status: DC | PRN

## 2018-03-12 MED ORDER — ACETAMINOPHEN 325 MG PO TABS: 325.0000 mg | ORAL_TABLET | Freq: Four times a day (QID) | ORAL | Status: DC | PRN

## 2018-03-12 MED ORDER — MENTHOL 3 MG MT LOZG
1.0000 | LOZENGE | OROMUCOSAL | Status: DC | PRN
  Filled 2018-03-12: qty 9

## 2018-03-12 MED ORDER — INSULIN ASPART 100 UNIT/ML ~~LOC~~ SOLN
0.0000 [IU] | Freq: Three times a day (TID) | SUBCUTANEOUS | Status: DC
  Administered 2018-03-12: 3 [IU] via SUBCUTANEOUS
  Administered 2018-03-13: 5 [IU] via SUBCUTANEOUS
  Administered 2018-03-13 – 2018-03-14 (×2): 2 [IU] via SUBCUTANEOUS
  Filled 2018-03-12 (×4): qty 1

## 2018-03-12 MED ORDER — CEFAZOLIN SODIUM-DEXTROSE 2-4 GM/100ML-% IV SOLN
2.0000 g | Freq: Four times a day (QID) | INTRAVENOUS | Status: AC
  Administered 2018-03-12 – 2018-03-13 (×4): 2 g via INTRAVENOUS
  Filled 2018-03-12 (×6): qty 100

## 2018-03-12 MED ORDER — GABAPENTIN 300 MG PO CAPS
ORAL_CAPSULE | ORAL | Status: AC
  Administered 2018-03-12: 300 mg via ORAL
  Filled 2018-03-12: qty 1

## 2018-03-12 MED ORDER — VITAMIN B-12 1000 MCG PO TABS
1000.0000 ug | ORAL_TABLET | ORAL | Status: DC
  Administered 2018-03-13: 1000 ug via ORAL
  Filled 2018-03-12: qty 1

## 2018-03-12 MED ORDER — ACETAMINOPHEN 10 MG/ML IV SOLN
INTRAVENOUS | Status: DC | PRN
  Administered 2018-03-12: 1000 mg via INTRAVENOUS

## 2018-03-12 MED ORDER — INFLUENZA VAC SPLIT HIGH-DOSE 0.5 ML IM SUSY
0.5000 mL | PREFILLED_SYRINGE | INTRAMUSCULAR | Status: AC
  Administered 2018-03-13: 0.5 mL via INTRAMUSCULAR
  Filled 2018-03-12: qty 0.5

## 2018-03-12 MED ORDER — ALUM & MAG HYDROXIDE-SIMETH 200-200-20 MG/5ML PO SUSP: 30.0000 mL | ORAL | Status: DC | PRN

## 2018-03-12 MED ORDER — TETRACAINE HCL 1 % IJ SOLN
INTRAMUSCULAR | Status: DC | PRN
  Administered 2018-03-12: 5 mg via INTRASPINAL

## 2018-03-12 MED ORDER — METOCLOPRAMIDE HCL 10 MG PO TABS
10.0000 mg | ORAL_TABLET | Freq: Three times a day (TID) | ORAL | Status: DC
  Administered 2018-03-12 – 2018-03-14 (×7): 10 mg via ORAL
  Filled 2018-03-12 (×8): qty 1

## 2018-03-12 MED ORDER — PROPOFOL 500 MG/50ML IV EMUL
INTRAVENOUS | Status: DC | PRN
  Administered 2018-03-12: 60 ug/kg/min via INTRAVENOUS

## 2018-03-12 MED ORDER — ATENOLOL 25 MG PO TABS
50.0000 mg | ORAL_TABLET | Freq: Every day | ORAL | Status: DC
  Administered 2018-03-13 – 2018-03-14 (×2): 50 mg via ORAL
  Filled 2018-03-12 (×2): qty 2

## 2018-03-12 MED ORDER — BUPIVACAINE HCL (PF) 0.25 % IJ SOLN
INTRAMUSCULAR | Status: DC | PRN
  Administered 2018-03-12: 60 mL

## 2018-03-12 MED ORDER — VITAMIN D 1000 UNITS PO TABS
2000.0000 [IU] | ORAL_TABLET | Freq: Every day | ORAL | Status: DC
  Administered 2018-03-12 – 2018-03-14 (×3): 2000 [IU] via ORAL
  Filled 2018-03-12 (×3): qty 2

## 2018-03-12 MED ORDER — ONDANSETRON HCL 4 MG PO TABS: 4.0000 mg | ORAL_TABLET | Freq: Four times a day (QID) | ORAL | Status: DC | PRN

## 2018-03-12 MED ORDER — CHLORHEXIDINE GLUCONATE 4 % EX LIQD: 60.0000 mL | Freq: Once | CUTANEOUS | Status: DC

## 2018-03-12 MED ORDER — FENTANYL CITRATE (PF) 100 MCG/2ML IJ SOLN
INTRAMUSCULAR | Status: DC | PRN
  Administered 2018-03-12: 100 ug via INTRAVENOUS

## 2018-03-12 MED ORDER — ACETAMINOPHEN 10 MG/ML IV SOLN
1000.0000 mg | Freq: Four times a day (QID) | INTRAVENOUS | Status: AC
  Administered 2018-03-12 – 2018-03-13 (×4): 1000 mg via INTRAVENOUS
  Filled 2018-03-12 (×4): qty 100

## 2018-03-12 MED ORDER — HYDROCHLOROTHIAZIDE 25 MG PO TABS
25.0000 mg | ORAL_TABLET | Freq: Every day | ORAL | Status: DC
  Administered 2018-03-12 – 2018-03-14 (×2): 25 mg via ORAL
  Filled 2018-03-12 (×3): qty 1

## 2018-03-12 MED ORDER — SIMVASTATIN 20 MG PO TABS
10.0000 mg | ORAL_TABLET | Freq: Every day | ORAL | Status: DC
  Administered 2018-03-12 – 2018-03-13 (×2): 10 mg via ORAL
  Filled 2018-03-12 (×2): qty 1

## 2018-03-12 MED ORDER — SENNOSIDES-DOCUSATE SODIUM 8.6-50 MG PO TABS
1.0000 | ORAL_TABLET | Freq: Two times a day (BID) | ORAL | Status: DC
  Administered 2018-03-12 – 2018-03-14 (×4): 1 via ORAL
  Filled 2018-03-12 (×4): qty 1

## 2018-03-12 MED ORDER — ACETAMINOPHEN 10 MG/ML IV SOLN
INTRAVENOUS | Status: AC
  Filled 2018-03-12: qty 100

## 2018-03-12 MED ORDER — NEOMYCIN-POLYMYXIN B GU 40-200000 IR SOLN
Status: AC
  Filled 2018-03-12: qty 20

## 2018-03-12 MED ORDER — TRAZODONE HCL 100 MG PO TABS
100.0000 mg | ORAL_TABLET | Freq: Every day | ORAL | Status: DC
  Administered 2018-03-12 – 2018-03-13 (×2): 50 mg via ORAL
  Filled 2018-03-12 (×2): qty 1

## 2018-03-12 MED ORDER — PANTOPRAZOLE SODIUM 40 MG PO TBEC
40.0000 mg | DELAYED_RELEASE_TABLET | Freq: Two times a day (BID) | ORAL | Status: DC
  Administered 2018-03-12 – 2018-03-14 (×4): 40 mg via ORAL
  Filled 2018-03-12 (×4): qty 1

## 2018-03-12 MED ORDER — CEFAZOLIN SODIUM-DEXTROSE 2-3 GM-%(50ML) IV SOLR
INTRAVENOUS | Status: DC | PRN
  Administered 2018-03-12: 2 g via INTRAVENOUS

## 2018-03-12 MED ORDER — ONDANSETRON HCL 4 MG/2ML IJ SOLN: 4.0000 mg | Freq: Once | INTRAMUSCULAR | Status: DC | PRN

## 2018-03-12 MED ORDER — PROPOFOL 10 MG/ML IV BOLUS
INTRAVENOUS | Status: DC | PRN
  Administered 2018-03-12 (×4): 20 mg via INTRAVENOUS

## 2018-03-12 MED ORDER — DEXAMETHASONE SODIUM PHOSPHATE 10 MG/ML IJ SOLN
INTRAMUSCULAR | Status: AC
  Administered 2018-03-12: 8 mg via INTRAVENOUS
  Filled 2018-03-12: qty 1

## 2018-03-12 MED ORDER — LIDOCAINE HCL (PF) 2 % IJ SOLN
INTRAMUSCULAR | Status: AC
  Filled 2018-03-12: qty 10

## 2018-03-12 MED ORDER — TRAMADOL HCL 50 MG PO TABS: 50.0000 mg | ORAL_TABLET | ORAL | Status: DC | PRN

## 2018-03-12 MED ORDER — MIDAZOLAM HCL 2 MG/2ML IJ SOLN
INTRAMUSCULAR | Status: AC
  Filled 2018-03-12: qty 2

## 2018-03-12 MED ORDER — CELECOXIB 200 MG PO CAPS
ORAL_CAPSULE | ORAL | Status: AC
  Administered 2018-03-12: 400 mg via ORAL
  Filled 2018-03-12: qty 1

## 2018-03-12 MED ORDER — AMLODIPINE BESYLATE 10 MG PO TABS
10.0000 mg | ORAL_TABLET | Freq: Every day | ORAL | Status: DC
  Administered 2018-03-12 – 2018-03-13 (×2): 10 mg via ORAL
  Filled 2018-03-12 (×2): qty 1

## 2018-03-12 SURGICAL SUPPLY — 75 items
BATTERY INSTRU NAVIGATION (MISCELLANEOUS) ×8 IMPLANT
BLADE SAW 70X12.5 (BLADE) ×1 IMPLANT
BLADE SAW 90X13X1.19 OSCILLAT (BLADE) ×2 IMPLANT
BLADE SAW 90X25X1.19 OSCILLAT (BLADE) ×2 IMPLANT
BONE CEMENT GENTAMICIN (Cement) ×4 IMPLANT
BTRY SRG DRVR LF (MISCELLANEOUS) ×4
CANISTER SUCT 1200ML W/VALVE (MISCELLANEOUS) ×2 IMPLANT
CANISTER SUCT 3000ML PPV (MISCELLANEOUS) ×4 IMPLANT
CEMENT BONE GENTAMICIN 40 (Cement) IMPLANT
CEMENT TIBIA MBT SIZE 5 (Knees) IMPLANT
COOLER POLAR GLACIER W/PUMP (MISCELLANEOUS) ×2 IMPLANT
COVER WAND RF STERILE (DRAPES) ×2 IMPLANT
CUFF TOURN 24 STER (MISCELLANEOUS) IMPLANT
CUFF TOURN 30 STER DUAL PORT (MISCELLANEOUS) ×1 IMPLANT
DRAPE SHEET LG 3/4 BI-LAMINATE (DRAPES) ×2 IMPLANT
DRSG DERMACEA 8X12 NADH (GAUZE/BANDAGES/DRESSINGS) ×2 IMPLANT
DRSG OPSITE POSTOP 4X14 (GAUZE/BANDAGES/DRESSINGS) ×2 IMPLANT
DRSG TEGADERM 4X4.75 (GAUZE/BANDAGES/DRESSINGS) ×2 IMPLANT
DURAPREP 26ML APPLICATOR (WOUND CARE) ×4 IMPLANT
ELECT CAUTERY BLADE 6.4 (BLADE) ×2 IMPLANT
ELECT REM PT RETURN 9FT ADLT (ELECTROSURGICAL) ×2
ELECTRODE REM PT RTRN 9FT ADLT (ELECTROSURGICAL) ×1 IMPLANT
EX-PIN ORTHOLOCK NAV 4X150 (PIN) ×4 IMPLANT
FEMUR SIGMA PS SZ 5.0 L (Femur) ×1 IMPLANT
GLOVE BIOGEL M STRL SZ7.5 (GLOVE) ×4 IMPLANT
GLOVE BIOGEL PI IND STRL 7.5 (GLOVE) IMPLANT
GLOVE BIOGEL PI IND STRL 9 (GLOVE) ×1 IMPLANT
GLOVE BIOGEL PI INDICATOR 7.5 (GLOVE) ×6
GLOVE BIOGEL PI INDICATOR 9 (GLOVE) ×1
GLOVE INDICATOR 8.0 STRL GRN (GLOVE) ×2 IMPLANT
GLOVE SURG SYN 9.0  PF PI (GLOVE) ×1
GLOVE SURG SYN 9.0 PF PI (GLOVE) ×1 IMPLANT
GOWN STRL REUS W/ TWL LRG LVL3 (GOWN DISPOSABLE) ×2 IMPLANT
GOWN STRL REUS W/TWL 2XL LVL3 (GOWN DISPOSABLE) ×2 IMPLANT
GOWN STRL REUS W/TWL LRG LVL3 (GOWN DISPOSABLE) ×8
HEMOVAC 400CC 10FR (MISCELLANEOUS) ×2 IMPLANT
HOLDER FOLEY CATH W/STRAP (MISCELLANEOUS) ×2 IMPLANT
HOOD PEEL AWAY FLYTE STAYCOOL (MISCELLANEOUS) ×5 IMPLANT
KIT TURNOVER KIT A (KITS) ×2 IMPLANT
KNIFE SCULPS 14X20 (INSTRUMENTS) ×2 IMPLANT
LABEL OR SOLS (LABEL) ×2 IMPLANT
NDL SAFETY ECLIPSE 18X1.5 (NEEDLE) ×1 IMPLANT
NDL SPNL 20GX3.5 QUINCKE YW (NEEDLE) ×2 IMPLANT
NEEDLE HYPO 18GX1.5 SHARP (NEEDLE) ×2
NEEDLE SPNL 20GX3.5 QUINCKE YW (NEEDLE) ×4 IMPLANT
NS IRRIG 500ML POUR BTL (IV SOLUTION) ×2 IMPLANT
PACK TOTAL KNEE (MISCELLANEOUS) ×2 IMPLANT
PAD WRAPON POLAR KNEE (MISCELLANEOUS) ×1 IMPLANT
PATELLA DOME PFC 41MM (Knees) ×1 IMPLANT
PIN DRILL QUICK PACK ×2 IMPLANT
PIN FIXATION 1/8DIA X 3INL (PIN) ×6 IMPLANT
PLATE ROT INSERT 10MM SIZE 5 (Plate) ×1 IMPLANT
PULSAVAC PLUS IRRIG FAN TIP (DISPOSABLE) ×2
SOL .9 NS 3000ML IRR  AL (IV SOLUTION) ×1
SOL .9 NS 3000ML IRR AL (IV SOLUTION) ×1
SOL .9 NS 3000ML IRR UROMATIC (IV SOLUTION) ×1 IMPLANT
SOL PREP PVP 2OZ (MISCELLANEOUS) ×2
SOLUTION PREP PVP 2OZ (MISCELLANEOUS) ×1 IMPLANT
SPONGE DRAIN TRACH 4X4 STRL 2S (GAUZE/BANDAGES/DRESSINGS) ×2 IMPLANT
STAPLER SKIN PROX 35W (STAPLE) ×2 IMPLANT
STOCKINETTE IMPERV 14X48 (MISCELLANEOUS) ×1 IMPLANT
STRAP TIBIA SHORT (MISCELLANEOUS) ×2 IMPLANT
SUCTION FRAZIER HANDLE 10FR (MISCELLANEOUS) ×1
SUCTION TUBE FRAZIER 10FR DISP (MISCELLANEOUS) ×1 IMPLANT
SUT VIC AB 0 CT1 36 (SUTURE) ×3 IMPLANT
SUT VIC AB 1 CT1 36 (SUTURE) ×4 IMPLANT
SUT VIC AB 2-0 CT2 27 (SUTURE) ×2 IMPLANT
SYR 20CC LL (SYRINGE) ×2 IMPLANT
SYR 30ML LL (SYRINGE) ×4 IMPLANT
TIBIA MBT CEMENT SIZE 5 (Knees) ×2 IMPLANT
TIP FAN IRRIG PULSAVAC PLUS (DISPOSABLE) ×1 IMPLANT
TOWEL OR 17X26 4PK STRL BLUE (TOWEL DISPOSABLE) ×2 IMPLANT
TOWER CARTRIDGE SMART MIX (DISPOSABLE) ×2 IMPLANT
TRAY FOLEY MTR SLVR 16FR STAT (SET/KITS/TRAYS/PACK) ×2 IMPLANT
WRAPON POLAR PAD KNEE (MISCELLANEOUS) ×2

## 2018-03-12 NOTE — H&P (Signed)
The patient has been re-examined, and the chart reviewed, and there have been no interval changes to the documented history and physical.    The risks, benefits, and alternatives have been discussed at length. The patient expressed understanding of the risks benefits and agreed with plans for surgical intervention.  Tell [name], [name] M.D.    

## 2018-03-12 NOTE — NC FL2 (Signed)
[place] LEVEL OF CARE SCREENING TOOL     IDENTIFICATION  Patient Name: Greg Lam Birthdate: 11/06/1940 Sex: male Admission Date (Current Location): 03/12/2018  [place] and [place] Number:  Engineering geologist and Address:  [facility], [address] [place], [place] [address]      Provider Number: [id number]  Attending Physician Name and Address:  Dereck Leep, MD  Relative Name and Phone Number:       Current Level of Care: Hospital Recommended Level of Care: [place] Prior Approval Number:    Date Approved/Denied:   PASRR Number: [id number])  Discharge Plan: SNF    Current Diagnoses: Patient Active Problem List   Diagnosis Date Noted  . S/P total knee arthroplasty 03/12/2018  . B12 deficiency 09/11/2016  . Hyperlipidemia, mixed 09/11/2016  . DISH (diffuse idiopathic skeletal hyperostosis) 05/11/2015  . 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 Acquired hypothyroidism 10/04/2011  . Chronic kidney disease (CKD), stage III (moderate) ([place]) 10/04/2011  . Diabetes mellitus ([place]) 10/04/2011  . Essential hypertension 10/04/2011    Orientation RESPIRATION BLADDER Height & Weight     Self, Time, Situation, Place  O2(2 Liters Oxygen. ) Continent Weight:   Height:     BEHAVIORAL SYMPTOMS/MOOD NEUROLOGICAL BOWEL NUTRITION STATUS      Continent Diet(Diet: Regular )  AMBULATORY STATUS COMMUNICATION OF NEEDS Skin   Extensive Assist Verbally Surgical wounds(Incision: Left Knee. )                       Personal Care Assistance Level of Assistance  Bathing, Feeding, Dressing Bathing Assistance: Limited assistance Feeding assistance: Independent Dressing Assistance: Limited assistance     Functional Limitations Info  Sight, Hearing, Speech Sight Info: Adequate Hearing Info: Adequate Speech Info: Adequate     SPECIAL CARE FACTORS FREQUENCY  PT (By licensed PT), OT (By licensed OT)     PT Frequency: (5) OT Frequency: (5)            Contractures      Additional Factors Info  Code Status, Allergies Code Status Info: (not on file ) Allergies Info: (Codeine Sulfate Codeine, Propoxyphene)           Current Medications (03/12/2018):  This is the current hospital active medication list Current Facility-Administered Medications  Medication Dose Route Frequency Provider Last Rate Last Dose  . 0.9 %  sodium chloride infusion   Intravenous Continuous Gunnar Bulla, MD 75 mL/hr at 03/12/18 1018    . ceFAZolin (ANCEF) 2-4 GM/100ML-% IVPB           . ceFAZolin (ANCEF) 2-4 GM/100ML-% IVPB           . celecoxib (CELEBREX) 200 MG capsule           . chlorhexidine (HIBICLENS) 4 % liquid 4 application  60 mL Topical Once Hooten, Laurice Record, MD      . fentaNYL (SUBLIMAZE) injection 25 mcg  25 mcg Intravenous Q5 min PRN Gunnar Fusi, MD      . ondansetron [facility]) injection 4 mg  4 mg Intravenous Once PRN Gunnar Fusi, MD      . tranexamic acid (CYKLOKAPRON) IVPB 1,000 mg  1,000 mg Intravenous Once Dereck Leep, MD 600 mL/hr at 03/12/18 1604 1,000 mg at 03/12/18 1604     Discharge Medications: Please see discharge summary for a list of discharge medications.  Relevant Imaging Results:  Relevant Lab Results:   Additional Information (SSN: [id number])  Jaquana Geiger, Veronia Beets, LCSW

## 2018-03-12 NOTE — Transfer of Care (Signed)
Immediate Anesthesia Transfer of Care Note  Patient: Greg Lam  Procedure(s) Performed: COMPUTER ASSISTED TOTAL KNEE ARTHROPLASTY (Left Knee)  Patient Location: PACU  Anesthesia Type:Spinal  Level of Consciousness: drowsy and responds to stimulation  Airway & Oxygen Therapy: Patient Spontanous Breathing and Patient connected to nasal cannula oxygen  Post-op Assessment: Report given to RN and Post -op Vital signs reviewed and stable  Post vital signs: Reviewed and stable  Last Vitals:  Vitals Value Taken Time  BP 106/58 03/12/2018  3:21 PM  Temp    Pulse 58 03/12/2018  3:21 PM  Resp 11 03/12/2018  3:21 PM  SpO2 98 % 03/12/2018  3:21 PM    Last Pain:  Vitals:   03/12/18 1011  TempSrc: Oral  PainSc: 0-No pain         Complications: No apparent anesthesia complications

## 2018-03-12 NOTE — Anesthesia Procedure Notes (Signed)
Spinal  Patient location during procedure: OR Start time: 03/12/2018 11:30 AM End time: 03/12/2018 11:31 AM Staffing Resident/CRNA: Bernardo Heater, CRNA Performed: resident/CRNA  Preanesthetic Checklist Completed: patient identified, site marked, surgical consent, pre-op evaluation, timeout performed, IV checked, risks and benefits discussed and monitors and equipment checked Spinal Block Patient position: sitting Prep: ChloraPrep Patient monitoring: heart rate, continuous pulse ox, blood pressure and cardiac monitor Approach: midline Location: L3-4 Injection technique: single-shot Needle Needle type: Introducer and Pencan  Needle gauge: 24 G Needle length: 9 cm Additional Notes Negative paresthesia. Negative blood return. Positive free-flowing CSF. Expiration date of kit checked and confirmed. Patient tolerated procedure well, without complications.

## 2018-03-12 NOTE — Anesthesia Post-op Follow-up Note (Signed)
Anesthesia QCDR form completed.        

## 2018-03-12 NOTE — Op Note (Signed)
OPERATIVE NOTE  DATE OF SURGERY:  03/12/2018  PATIENT NAME:  Dupree Givler   DOB: November 15, 1940  MRN: [id number]  PRE-OPERATIVE DIAGNOSIS: Degenerative arthrosis of the left knee, primary  POST-OPERATIVE DIAGNOSIS:  Same  PROCEDURE:  Left total knee arthroplasty using computer-assisted navigation  SURGEON:  Marciano Sequin. M.D.  ASSISTANT:  Vance Peper, PA (present and scrubbed throughout the case, critical for assistance with exposure, retraction, instrumentation, and closure)  ANESTHESIA: spinal  ESTIMATED BLOOD LOSS: 50 mL  FLUIDS REPLACED: 1300 mL of crystalloid  TOURNIQUET TIME: 103 minutes  DRAINS: 2 medium Hemovac drains  SOFT TISSUE RELEASES: Anterior cruciate ligament, posterior cruciate ligament, deep and superficial medial collateral ligament, patellofemoral ligament  IMPLANTS UTILIZED: DePuy PFC Sigma size 5 posterior stabilized femoral component (cemented), size 5 MBT tibial component (cemented), 41 mm 3 peg oval dome patella (cemented), and a 10 mm stabilized rotating platform polyethylene insert.  INDICATIONS FOR SURGERY: Sulo Janczak is a 77 y.o. year old male with a long history of progressive knee pain. X-rays demonstrated severe degenerative changes in tricompartmental fashion. The patient had not seen any significant improvement despite conservative nonsurgical intervention. After discussion of the risks and benefits of surgical intervention, the patient expressed understanding of the risks benefits and agree with plans for total knee arthroplasty.   The risks, benefits, and alternatives were discussed at length including but not limited to the risks of infection, bleeding, nerve injury, stiffness, blood clots, the need for revision surgery, cardiopulmonary complications, among others, and they were willing to proceed.  PROCEDURE IN DETAIL: The patient was brought into the operating room and, after adequate spinal anesthesia was achieved, a tourniquet was placed on  the patient's upper thigh. The patient's knee and leg were cleaned and prepped with alcohol and DuraPrep and draped in the usual sterile fashion. A "timeout" was performed as per usual protocol. The lower extremity was exsanguinated using an Esmarch, and the tourniquet was inflated to 300 mmHg. An anterior longitudinal incision was made followed by a standard mid vastus approach. The deep fibers of the medial collateral ligament were elevated in a subperiosteal fashion off of the medial flare of the tibia so as to maintain a continuous soft tissue sleeve. The patella was subluxed laterally and the patellofemoral ligament was incised. Inspection of the knee demonstrated severe degenerative changes with full-thickness loss of articular cartilage. Osteophytes were debrided using a rongeur. Anterior and posterior cruciate ligaments were excised. Two 4.0 mm Schanz pins were inserted in the femur and into the tibia for attachment of the array of trackers used for computer-assisted navigation. Hip center was identified using a circumduction technique. Distal landmarks were mapped using the computer. The distal femur and proximal tibia were mapped using the computer. The distal femoral cutting guide was positioned using computer-assisted navigation so as to achieve a 5 distal valgus cut. The femur was sized and it was felt that a size 5 femoral component was appropriate. A size 5 femoral cutting guide was positioned and the anterior cut was performed and verified using the computer. This was followed by completion of the posterior and chamfer cuts. Femoral cutting guide for the central box was then positioned in the center box cut was performed.  Attention was then directed to the proximal tibia. Medial and lateral menisci were excised. The extramedullary tibial cutting guide was positioned using computer-assisted navigation so as to achieve a 0 varus-valgus alignment and 0 posterior slope. The cut was performed and  verified using the computer. The  proximal tibia was sized and it was felt that a size 5 tibial tray was appropriate. Tibial and femoral trials were inserted followed by insertion of a 10 mm polyethylene insert.The knee was felt to be tight medially. A Cobb elevator was used to elevate the superficial fibers of the medial collateral ligament. This allowed for excellent mediolateral soft tissue balancing both in flexion and in full extension. Finally, the patella was cut and prepared so as to accommodate a 41 mm 3 peg oval dome patella. A patella trial was placed and the knee was placed through a range of motion with excellent patellar tracking appreciated. The femoral trial was removed after debridement of posterior osteophytes. The central post-hole for the tibial component was reamed followed by insertion of a keel punch. Tibial trials were then removed. Cut surfaces of bone were irrigated with copious amounts of normal saline with antibiotic solution using pulsatile lavage and then suctioned dry. Polymethylmethacrylate cement with gentamicin was prepared in the usual fashion using a vacuum mixer. Cement was applied to the cut surface of the proximal tibia as well as along the undersurface of a size 5 MBT tibial component. Tibial component was positioned and impacted into place. Excess cement was removed using [employer] fast streamer. Cement was then applied to the cut surfaces of the femur as well as along the posterior flanges of the size 5 femoral component. The femoral component was positioned and impacted into place. Excess cement was removed using [employer] fast streamer. A 10 mm polyethylene trial was inserted and the knee was brought into full extension with steady axial compression applied. Finally, cement was applied to the backside of a 41 mm 3 peg oval dome patella and the patellar component was positioned and patellar clamp applied. Excess cement was removed using [employer] fast streamer. After adequate curing of the cement,  the tourniquet was deflated after a total tourniquet time of 103 minutes. Hemostasis was achieved using electrocautery. The knee was irrigated with copious amounts of normal saline with antibiotic solution using pulsatile lavage and then suctioned dry. 20 mL of 1.3% Exparel and 60 mL of 0.25% Marcaine in 40 mL of normal saline was injected along the posterior capsule, medial and lateral gutters, and along the arthrotomy site. A 10 mm stabilized rotating platform polyethylene insert was inserted and the knee was placed through a range of motion with excellent mediolateral soft tissue balancing appreciated and excellent patellar tracking noted. 2 medium drains were placed in the wound bed and brought out through separate stab incisions. The medial parapatellar portion of the incision was reapproximated using interrupted sutures of #1 Vicryl. Subcutaneous tissue was approximated in layers using first #0 Vicryl followed #2-0 Vicryl. The skin was approximated with skin staples. A sterile dressing was applied.  The patient tolerated the procedure well and was transported to the recovery room in stable condition.    Bladen Umar P. Holley Bouche., M.D.

## 2018-03-12 NOTE — Anesthesia Preprocedure Evaluation (Signed)
Anesthesia Evaluation  Patient identified by MRN, date of birth, ID band Patient awake    History of Anesthesia Complications Negative for: history of anesthetic complications  Airway Mallampati: II       Dental   Pulmonary neg sleep apnea, neg COPD, former smoker,           Cardiovascular hypertension, Pt. on medications and Pt. on home beta blockers (-) Past MI and (-) CHF (-) dysrhythmias (-) Valvular Problems/Murmurs     Neuro/Psych neg Seizures    GI/Hepatic Neg liver ROS, GERD  Medicated,  Endo/Other  diabetes, Type 2, Oral Hypoglycemic AgentsHypothyroidism   Renal/GU Renal InsufficiencyRenal disease     Musculoskeletal   Abdominal   Peds  Hematology   Anesthesia Other Findings   Reproductive/Obstetrics                             Anesthesia Physical Anesthesia Plan  ASA: III  Anesthesia Plan: General   Post-op Pain Management:    Induction: Intravenous  PONV Risk Score and Plan: 2  Airway Management Planned: Nasal Cannula  Additional Equipment:   Intra-op Plan:   Post-operative Plan:   Informed Consent: I have reviewed the patients History and Physical, chart, labs and discussed the procedure including the risks, benefits and alternatives for the proposed anesthesia with the patient or authorized representative who has indicated his/her understanding and acceptance.     Plan Discussed with:   Anesthesia Plan Comments:         Anesthesia Quick Evaluation

## 2018-03-12 NOTE — Progress Notes (Signed)
IS education complete, pt understands technique and reason for use. Pt inspire 2251ml+. Pt independent with use

## 2018-03-13 ENCOUNTER — Encounter: Payer: Self-pay | Admitting: Orthopedic Surgery

## 2018-03-13 LAB — GLUCOSE, CAPILLARY
GLUCOSE-CAPILLARY: 147 mg/dL — AB (ref 70–99)
GLUCOSE-CAPILLARY: 230 mg/dL — AB (ref 70–99)
Glucose-Capillary: 97 mg/dL (ref 70–99)
Glucose-Capillary: 135 mg/dL — ABNORMAL HIGH (ref 70–99)

## 2018-03-13 MED ORDER — TRAMADOL HCL 50 MG PO TABS: 50.0000 mg | ORAL_TABLET | ORAL | 0 refills | Status: AC | PRN

## 2018-03-13 MED ORDER — ENOXAPARIN SODIUM 40 MG/0.4ML ~~LOC~~ SOLN: 40.0000 mg | SUBCUTANEOUS | 0 refills | Status: AC

## 2018-03-13 MED ORDER — OXYCODONE HCL 5 MG PO TABS: 5.0000 mg | ORAL_TABLET | ORAL | 0 refills | Status: AC | PRN

## 2018-03-13 NOTE — Discharge Summary (Addendum)
Physician Discharge Summary  Patient ID: Greg Lam MRN: [id number] DOB/AGE: 1941/05/15 77 y.o.  Admit date: 03/12/2018 Discharge date: 03/14/2018  Admission Diagnoses:  PRIMARY OSTEOARTHRITIS OF LEFT KNEE   Discharge Diagnoses: Patient Active Problem List   Diagnosis Date Noted  . S/P total knee arthroplasty 03/12/2018  . B12 deficiency 09/11/2016  . Hyperlipidemia, mixed 09/11/2016  . DISH (diffuse idiopathic skeletal hyperostosis) 05/11/2015  . 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 Acquired hypothyroidism 10/04/2011  . Chronic kidney disease (CKD), stage III (moderate) ([place]) 10/04/2011  . Diabetes mellitus ([place]) 10/04/2011  . Essential hypertension 10/04/2011    Past Medical History:  Diagnosis Date  . Arthritis   . Chronic kidney disease   . Degenerative arthritis of left knee   . Diabetes mellitus without complication ([place])   . GERD (gastroesophageal reflux disease)   . History of kidney stones   . Hyperlipidemia   . Hypertension   . Hypothyroidism      Transfusion: No transfusions during this admission   Consultants (if any):   Discharged Condition: Improved  Hospital Course: Greg Lam is an 77 y.o. male who was admitted 03/12/2018 with a diagnosis of degenerative arthrosis left knee and went to the operating room on 03/12/2018 and underwent the above named procedures.    Surgeries:Procedure(s): COMPUTER ASSISTED TOTAL KNEE ARTHROPLASTY on 03/12/2018  PRE-OPERATIVE DIAGNOSIS: Degenerative arthrosis of the left knee, primary  POST-OPERATIVE DIAGNOSIS:  Same  PROCEDURE:  Left total knee arthroplasty using computer-assisted navigation  SURGEON:  Marciano Sequin. M.D.  ASSISTANT:  Vance Peper, PA (present and scrubbed throughout the case, critical for assistance with exposure, retraction, instrumentation, and closure)  ANESTHESIA: spinal  ESTIMATED  BLOOD LOSS: 50 mL  FLUIDS REPLACED: 1300 mL of crystalloid  TOURNIQUET TIME: 103 minutes  DRAINS: 2 medium Hemovac drains  SOFT TISSUE RELEASES: Anterior cruciate ligament, posterior cruciate ligament, deep and superficial medial collateral ligament, patellofemoral ligament  IMPLANTS UTILIZED: DePuy PFC Sigma size 5 posterior stabilized femoral component (cemented), size 5 MBT tibial component (cemented), 41 mm 3 peg oval dome patella (cemented), and a 10 mm stabilized rotating platform polyethylene insert.  INDICATIONS FOR SURGERY: Greg Lam is a 77 y.o. year old male with a long history of progressive knee pain. X-rays demonstrated severe degenerative changes in tricompartmental fashion. The patient had not seen any significant improvement despite conservative nonsurgical intervention. After discussion of the risks and benefits of surgical intervention, the patient expressed understanding of the risks benefits and agree with plans for total knee arthroplasty.   The risks, benefits, and alternatives were discussed at length including but not limited to the risks of infection, bleeding, nerve injury, stiffness, blood clots, the need for revision surgery, cardiopulmonary complications, among others, and they were willing to proceed. Patient tolerated the surgery well. No complications .Patient was taken to PACU where she was stabilized and then transferred to the orthopedic floor.  Patient started on Lovenox 30 mg q 12 hrs. Foot pumps applied bilaterally at 80 mm hgb. Heels elevated off bed with rolled towels. No evidence of DVT. Calves non tender. Negative Homan. Physical therapy started on day #1 for gait training and transfer with OT starting on  day #1 for ADL and assisted devices. Patient has done well with therapy. Ambulated greater than 200 feet upon being discharged.  Able to ascend and descend 4 steps safely and independently  Patient's IV And Foley were discontinued on day #  1  with Hemovac being discontinued on day #2. Dressing was changed on day 2 prior to patient being discharged   He was given perioperative antibiotics:  Anti-infectives (From admission, onward)   Start     Dose/Rate Route Frequency Ordered Stop   03/12/18 1800  ceFAZolin (ANCEF) IVPB 2g/100 mL premix     2 g 200 mL/hr over 30 Minutes Intravenous Every 6 hours 03/12/18 1649 03/13/18 1759   03/12/18 0938  ceFAZolin (ANCEF) 2-4 GM/100ML-% IVPB    Note to Pharmacy:  Phineas Real   : cabinet override      03/12/18 6384 03/12/18 2159   03/12/18 0937  ceFAZolin (ANCEF) 2-4 GM/100ML-% IVPB    Note to Pharmacy:  Phineas Real   : cabinet override      03/12/18 0937 03/12/18 2159   03/12/18 0600  ceFAZolin (ANCEF) IVPB 2g/100 mL premix  Status:  Discontinued     2 g 200 mL/hr over 30 Minutes Intravenous On call to O.R. 03/11/18 2228 03/12/18 6659    .  He was fitted with AV 1 compression foot pump devices, instructed on heel pumps, early ambulation, and fitted with TED stockings bilaterally for DVT prophylaxis.  He benefited maximally from the hospital stay and there were no complications.    Recent vital signs:  Vitals:   03/12/18 2337 03/13/18 0341  BP: (!) 106/50 (!) 105/57  Pulse: (!) 58 (!) 56  Resp: 19 18  Temp: 97.8 F (36.6 C) (!) 97.4 F (36.3 C)  SpO2: 98% 99%    Recent laboratory studies:  Lab Results  Component Value Date   HGB 13.3 02/26/2018   HGB 11.5 (L) 04/29/2013   HGB 11.8 (L) 04/28/2013   Lab Results  Component Value Date   WBC 6.7 02/26/2018   PLT 157 02/26/2018   Lab Results  Component Value Date   INR 1.11 02/26/2018   Lab Results  Component Value Date   NA 140 02/26/2018   K 4.1 02/26/2018   CL 103 02/26/2018   CO2 27 02/26/2018   BUN 25 (H) 02/26/2018   CREATININE 1.69 (H) 02/26/2018   GLUCOSE 136 (H) 02/26/2018    Discharge Medications:   Allergies as of 03/13/2018      Reactions   Codeine Sulfate [codeine] Shortness Of Breath    Propoxyphene Hives, Shortness Of Breath      Medication List    TAKE these medications   amLODipine 10 MG tablet Commonly known as:  NORVASC Take 10 mg by mouth at bedtime.   amoxicillin 500 MG capsule Commonly known as:  AMOXIL Take 2,000 mg by mouth once. 1 hour prior to dental appointment   atenolol 50 MG tablet Commonly known as:  TENORMIN Take 50 mg by mouth daily.   B-12 1000 MCG Lozg Take 1 tablet by mouth daily as needed.   enoxaparin 40 MG/0.4ML injection Commonly known as:  LOVENOX Inject 0.4 mLs (40 mg total) into the skin daily for 14 days. Start taking on:  03/15/2018   glimepiride 2 MG tablet Commonly known as:  AMARYL Take 3 mg by mouth daily. Takes 1 tablet in the morning and 1/2 tablet in the evening   levothyroxine 175 MCG tablet Commonly known as:  SYNTHROID, LEVOTHROID Take 175 mcg by mouth daily before breakfast.   losartan-hydrochlorothiazide 100-25 MG tablet Commonly known as:  HYZAAR Take 1 tablet by mouth daily.   oxyCODONE 5 MG immediate release tablet Commonly known as:  Oxy IR/ROXICODONE Take 1 tablet (  5 mg total) by mouth every 4 (four) hours as needed for moderate pain (pain score 4-6).   ranitidine 150 MG tablet Commonly known as:  ZANTAC Take 150 mg by mouth 2 (two) times daily.   simvastatin 10 MG tablet Commonly known as:  ZOCOR Take 10 mg by mouth at bedtime.   sulfaSALAzine 500 MG tablet Commonly known as:  AZULFIDINE Take 500 mg by mouth 2 (two) times daily.   traMADol 50 MG tablet Commonly known as:  ULTRAM Take by mouth 2 (two) times daily as needed. What changed:  Another medication with the same name was added. Make sure you understand how and when to take each.   traMADol 50 MG tablet Commonly known as:  ULTRAM Take 1-2 tablets (50-100 mg total) by mouth every 4 (four) hours as needed for moderate pain. What changed:  You were already taking a medication with the same name, and this prescription was added. Make  sure you understand how and when to take each.   traZODone 50 MG tablet Commonly known as:  DESYREL Take 100 mg by mouth at bedtime.   Vitamin D3 2000 units Tabs Take 1 tablet by mouth daily.            Durable Medical Equipment  (From admission, onward)         Start     Ordered   03/12/18 1649  DME Walker rolling  Once    Question:  Patient needs a walker to treat with the following condition  Answer:  Total knee replacement status   03/12/18 1649   03/12/18 1649  DME Bedside commode  Once    Question:  Patient needs a bedside commode to treat with the following condition  Answer:  Total knee replacement status   03/12/18 1649          Diagnostic Studies: Dg Knee Left Port  Result Date: 03/12/2018 CLINICAL DATA:  Left knee replacement EXAM: PORTABLE LEFT KNEE - 1-2 VIEW COMPARISON:  None. FINDINGS: Status post left knee replacement. Anatomic alignment. Hardware appears intact. No complicating feature. Anterior staples noted. Surgical drains present. Expected soft tissue edema. Peripheral atherosclerosis noted. IMPRESSION: Expected appearance and alignment status post left knee arthroplasty Electronically Signed   By: Jerilynn Mages.  Shick M.D.   On: 03/12/2018 16:03    Disposition:   Discharge Instructions    Increase activity slowly   Complete by:  As directed       Follow-up Information    Watt Climes, PA On 03/27/2018.   Specialty:  Physician Assistant Why:  at 9:45am Contact information: [place] [address] [phone]        Dereck Leep, MD On 04/24/2018.   Specialty:  Orthopedic Surgery Why:  at 11:15am Contact information: [place] [address] [phone]            Signed: Watt Climes 03/13/2018, 7:27 AM

## 2018-03-13 NOTE — Progress Notes (Signed)
Clinical Social Worker (CSW) received SNF consult. PT is recommending home health. RN case manager aware of above. Please reconsult if future social work needs arise. CSW signing off.   Morris Longenecker, LCSW [phone] 

## 2018-03-13 NOTE — Progress Notes (Signed)
   Subjective: 1 Day Post-Op Procedure(s) (LRB): COMPUTER ASSISTED TOTAL KNEE ARTHROPLASTY (Left) Patient reports pain as 0 on 0-10 scale.   Patient is well, and has had no acute complaints or problems We will start therapy today.  Plan is to go Home after hospital stay. no nausea and no vomiting Patient denies any chest pains or shortness of breath. Objective: Vital signs in last 24 hours: Temp:  [97.3 F (36.3 C)-97.9 F (36.6 C)] 97.4 F (36.3 C) (10/24 0341) Pulse Rate:  [56-66] 56 (10/24 0341) Resp:  [11-20] 18 (10/24 0341) BP: (104-150)/(50-80) 105/57 (10/24 0341) SpO2:  [98 %-100 %] 99 % (10/24 0341) Weight:  [103.8 kg] 103.8 kg (10/23 1951) Heels are non tender and elevated off the bed using rolled towels Intake/Output from previous day: 10/23 0701 - 10/24 0700 In: 1700 [I.V.:1700] Out: 455 [Urine:325; Drains:80; Blood:50] Intake/Output this shift: No intake/output data recorded.  No results for input(s): HGB in the last 72 hours. No results for input(s): WBC, RBC, HCT, PLT in the last 72 hours. No results for input(s): NA, K, CL, CO2, BUN, CREATININE, GLUCOSE, CALCIUM in the last 72 hours. No results for input(s): LABPT, INR in the last 72 hours.  EXAM General - Patient is Alert, Appropriate and Oriented Extremity - Neurologically intact Neurovascular intact Sensation intact distally Intact pulses distally Dorsiflexion/Plantar flexion intact Compartment soft Dressing - dressing C/D/I Motor Function - intact, moving foot and toes well on exam.  Moving leg freely.  Able to do straight leg raise on his own.  Past Medical History:  Diagnosis Date  . Arthritis   . Chronic kidney disease   . Degenerative arthritis of left knee   . Diabetes mellitus without complication ([place])   . GERD (gastroesophageal reflux disease)   . History of kidney stones   . Hyperlipidemia   . Hypertension   . Hypothyroidism     Assessment/Plan: 1 Day Post-Op Procedure(s)  (LRB): COMPUTER ASSISTED TOTAL KNEE ARTHROPLASTY (Left) Active Problems:   S/P total knee arthroplasty  Estimated body mass index is 29.38 kg/m as calculated from the following:   Height as of 02/26/18: 6\' 2"  (1.88 m).   Weight as of this encounter: 103.8 kg. Advance diet Up with therapy D/C IV fluids Plan for discharge tomorrow Discharge home with home health  Labs: None DVT Prophylaxis - Lovenox, Foot Pumps and TED hose Weight-Bearing as tolerated to left leg D/C O2 and Pulse OX and try on Room Air Begin working on bowel movement  Jon R. Montandon Stearns 03/13/2018, 7:22 AM

## 2018-03-13 NOTE — Evaluation (Signed)
Occupational Therapy Evaluation Patient Details Name: Greg Lam MRN: [id number] DOB: 26-Feb-1941 Today's Date: 03/13/2018    History of Present Illness Pt is a 77 y/o M s/p L TKA. PMH: DM, CKD, HTN, hypothyroidism, hyperlipidemia   Clinical Impression   Pt seen for OT evaluation this date, POD#1 from above surgery. Pt was independent in all ADLs prior to surgery and did not require AE/DME for ADL and mobility. Pt is eager to return to PLOF with less pain and improved safety and independence. Pt currently requires MOD I for all ADL tasks and has PRN assist from spouse 24/7. Pt has had other knee replaced in past and is familiar with precautions for his R knee. Pt/spouse instructed in polar care and compression stocking mgt (including donning/doffing, wear schedule and positioning), falls prevention strategies, home/routines modifications, and DME/AE for LB bathing and dressing tasks.  Do not currently anticipate any OT needs following this hospitalization. No further OT services needed.     Follow Up Recommendations  No OT follow up    Equipment Recommendations  None recommended by OT    Recommendations for Other Services       Precautions / Restrictions Precautions Precautions: Knee  Restrictions Weight Bearing Restrictions: Yes LLE Weight Bearing: Weight bearing as tolerated      Mobility Bed Mobility Overal bed mobility: Modified Independent                Transfers Overall transfer level: Needs assistance Equipment used: Rolling walker (2 wheeled) Transfers: Sit to/from Stand Sit to Stand: Supervision         General transfer comment: Pt eager to stand/move, needed VC to slow down.     Balance Overall balance assessment: Needs assistance Sitting-balance support: No upper extremity supported;Feet supported Sitting balance-Leahy Scale: Good     Standing balance support: During functional activity;No upper extremity supported Standing balance-Leahy Scale:  Fair Standing balance comment: Pt able to stand at sink without BUE support to brush teeth.                           ADL either performed or assessed with clinical judgement   ADL Overall ADL's : Needs assistance/impaired       Grooming Details (indicate cue type and reason): Pt stood at sink with good balance and did not need BUE support to brush teeth.                                General ADL Comments: Pt MOD I for all ADL tasks with PRN assistance from spouse for LB dressing/bathing tasks.     Vision Baseline Vision/History: Wears glasses Wears Glasses: At all times Patient Visual Report: No change from baseline       Perception     Praxis      Pertinent Vitals/Pain Pain Assessment: No/denies pain  Pain Intervention(s): Monitored during session     Hand Dominance     Extremity/Trunk Assessment Upper Extremity Assessment Upper Extremity Assessment: Overall WFL for tasks assessed   Lower Extremity Assessment Lower Extremity Assessment: Defer to PT evaluation       Communication Communication Communication: HOH   Cognition Arousal/Alertness: Awake/alert Behavior During Therapy: WFL for tasks assessed/performed Overall Cognitive Status: Within Functional Limits for tasks assessed  General Comments       Exercises  Other Exercises Other Exercises: Pt/spouse educated in polar care and compression sock mgt including donning/doffing, wear schedule and positioning. Other Exercises: Pt/spouse educated in use of AE/DME for completing ADL and IADL tasks.  Other Exercises: Pt/spouse educated home environment modifications for completing ADL and IADL tasks.    Shoulder Instructions      Home Living Family/patient expects to be discharged to:: Private residence Living Arrangements: Spouse/significant other Available Help at Discharge: Family;Available 24 hours/day Type of Home:  House Home Access: Stairs to enter [employer] of Steps: 2 Entrance Stairs-Rails: Left Home Layout: Laundry or work area in basement;One level     Bathroom Shower/Tub: Walk-in shower;Tub/shower unit   Bathroom Toilet: Standard Bathroom Accessibility: Yes How Accessible: Accessible via walker Home Equipment: [place] - 2 wheels;Cane - single point;Bedside commode          Prior Functioning/Environment Level of Independence: Independent        Comments: Ind with all ADLs, pt very active with outside work.  No falls in the past 12 months.  No use of AD.         OT Problem List:       OT Treatment/Interventions:      OT Goals(Current goals can be found in the care plan section) Acute Rehab OT Goals Patient Stated Goal: get back to working outside in the yard OT Goal Formulation: All assessment and education complete, DC therapy Potential to Achieve Goals: Good  OT Frequency:     Barriers to D/C:            Co-evaluation              AM-PAC PT "6 Clicks" Daily Activity     Outcome Measure Help from another person eating meals?: None Help from another person taking care of personal grooming?: None Help from another person toileting, which includes using toliet, bedpan, or urinal?: None Help from another person bathing (including washing, rinsing, drying)?: A Little Help from another person to put on and taking off regular upper body clothing?: None Help from another person to put on and taking off regular lower body clothing?: A Little 6 Click Score: 22   End of Session Equipment Utilized During Treatment: Gait belt;Rolling walker  Activity Tolerance: Patient tolerated treatment well Patient left: in bed;with call bell/phone within reach;with bed alarm set;with family/visitor present;with SCD's reapplied  OT Visit Diagnosis: Other abnormalities of gait and mobility (R26.89)                Time: 1610-9604 OT Time Calculation (min): 37  min Charges:     Jadene Pierini OTS  03/13/2018, 3:36 PM

## 2018-03-13 NOTE — Evaluation (Signed)
Physical Therapy Evaluation Patient Details Name: Greg Lam MRN: [id number] DOB: 04-Jan-1941 Today's Date: 03/13/2018   History of Present Illness  Pt is a 77 y/o M s/p L TKA.   Clinical Impression  Pt is s/p L TKA resulting in the deficits listed below (see PT Problem List). Greg Lam was ind with all aspects of mobility and ADLs PTA.  He currently requires min guard assist for safety with transfers and ambulation with cues provided for proper technique.  Pt with L knee hyperextension x3 while ambulating. Pt will benefit from skilled PT to increase their independence and safety with mobility to allow discharge to the venue listed below.     Follow Up Recommendations Home health PT    Equipment Recommendations  None recommended by PT    Recommendations for Other Services       Precautions / Restrictions Precautions Precautions: Fall;Knee Precaution Booklet Issued: Yes (comment) Precaution Comments: Exercise handout provided and pt instructed in no pillow under knee Required Braces or Orthoses: Knee Immobilizer - Left Knee Immobilizer - Left: ("if unable to perform SLR".  Did not use this session. ) Restrictions Weight Bearing Restrictions: Yes LLE Weight Bearing: Weight bearing as tolerated      Mobility  Bed Mobility Overal bed mobility: Modified Independent             General bed mobility comments: Heavy use of bed rail but does not require physical assist or cues.   Transfers Overall transfer level: Needs assistance Equipment used: Rolling walker (2 wheeled) Transfers: Sit to/from Stand Sit to Stand: Min guard         General transfer comment: Cues for proper hand placement and to keep RW with him as he backs up to sit.   Ambulation/Gait Ambulation/Gait assistance: Min guard Gait Distance (Feet): 180 Feet Assistive device: Rolling walker (2 wheeled) Gait Pattern/deviations: Decreased step length - right;Decreased weight shift to left;Decreased stance  time - left;Antalgic Gait velocity: decreased   General Gait Details: Pt ambulates with elbows locked in E and requries cues x3 to relax shoulders.  Cues for upright posture.  Pt reports and demonstrates L knee hyperextension x3 and cues provided to keep L knee slightly bent.   Stairs            Wheelchair Mobility    Modified Rankin (Stroke Patients Only)       Balance Overall balance assessment: Needs assistance Sitting-balance support: No upper extremity supported;Feet supported Sitting balance-Leahy Scale: Good     Standing balance support: No upper extremity supported;During functional activity Standing balance-Leahy Scale: Fair Standing balance comment: Pt able to stand statically without UE support but relies on RW to ambulate                             Pertinent Vitals/Pain Pain Assessment: 0-10 Pain Score: 0-No pain Pain Location: Pt denies pain Pain Intervention(s): Monitored during session    Home Living Family/patient expects to be discharged to:: Private residence Living Arrangements: Spouse/significant other Available Help at Discharge: Family;Available 24 hours/day Type of Home: House Home Access: Stairs to enter Entrance Stairs-Rails: Left Entrance Stairs-Number of Steps: 2 Home Layout: Laundry or work area in basement;One level Home Equipment: [place] - 2 wheels;Cane - single point;Bedside commode      Prior Function Level of Independence: Independent         Comments: Ind with all ADLs, pt very active with outside work.  No falls  in the past 6 months.  No use of AD.      Hand Dominance        Extremity/Trunk Assessment   Upper Extremity Assessment Upper Extremity Assessment: Defer to OT evaluation    Lower Extremity Assessment Lower Extremity Assessment: LLE deficits/detail LLE Deficits / Details: Functionally pt able to perform heel slide, knee presses, knee flexion without assist.  Some instability in L knee with  hyperextension while ambulating.  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 BP taken in supine at start of session: 116/56, sitting EOB 122/54, standing at bedside 141/61, sitting at end of session 122/56.     Exercises Total Joint Exercises Ankle Circles/Pumps: AROM;Both;10 reps;Supine Quad Sets: Strengthening;Both;10 reps;Supine Heel Slides: AROM;Left;10 reps;Supine Knee Flexion: AAROM;Left;5 reps;Seated;Other (comment)(with 5 second holds) Goniometric ROM: 0-104 Marching in Standing: AROM;Both;10 reps;Standing   Assessment/Plan    PT Assessment Patient needs continued PT services  PT Problem List Decreased strength;Decreased activity tolerance;Decreased range of motion;Decreased balance;Decreased knowledge of use of DME;Pain;Impaired sensation;Decreased knowledge of precautions;Decreased safety awareness       PT Treatment Interventions DME instruction;Gait training;Stair training;Functional mobility training;Therapeutic activities;Therapeutic exercise;Balance training;Neuromuscular re-education;Patient/family education;Modalities    PT Goals (Current goals can be found in the Care Plan section)  Acute Rehab PT Goals Patient Stated Goal: to return to PLOF PT Goal Formulation: With patient Time For Goal Achievement: 03/27/18 Potential to Achieve Goals: Good    Frequency BID   Barriers to discharge        Co-evaluation               AM-PAC PT "6 Clicks" Daily Activity  Outcome Measure  Difficulty turning over in bed (including adjusting bedclothes, sheets and blankets)?: A Little Difficulty moving from lying on back to sitting on the side of the bed? : Unable Difficulty sitting down on and standing up from a chair with arms (e.g., wheelchair, bedside commode, etc,.)?: A Lot Help needed moving to and from a bed to chair (including a wheelchair)?: A Little Help needed walking in hospital room?: A Little Help needed climbing 3-5 steps with a railing? : A Little 6 Click Score: 15    End of Session Equipment Utilized During Treatment: Gait belt Activity Tolerance: Patient tolerated treatment well Patient left: in chair;with call bell/phone within reach;with chair alarm set;with family/visitor present;Other (comment)(with bone foam and polar care in place) Nurse Communication: Mobility status PT Visit Diagnosis: Pain;Other abnormalities of gait and mobility (R26.89) Pain - Right/Left: Left Pain - part of body: Knee    Time: 0911-1002 PT Time Calculation (min) (ACUTE ONLY): 51 min   Charges:   PT Evaluation $PT Eval Moderate Complexity: 1 Mod PT Treatments $Gait Training: 8-22 mins $Therapeutic Exercise: 8-22 mins $Therapeutic Activity: 8-22 mins        Collie Siad PT, DPT 03/13/2018, 11:02 AM

## 2018-03-13 NOTE — Progress Notes (Signed)
Physical Therapy Treatment Patient Details Name: Greg Lam MRN: [id number] DOB: 03-Dec-1940 Today's Date: 03/13/2018    History of Present Illness Pt is a 77 y/o M s/p L TKA.     PT Comments    Participated in exercises as described below.  To edge of bed with rail but no assist.  Stood with min guard and verbal cues for safety.  Pt was able to stand and ambulate x 1 around unit with walker and min guard.  He did have 1 episode where his knee hyperextended.  He states it buckles backwards but it seemed to me he flexed.  He was able to control without assist.  Pt voices concern.  Education provided.      Follow Up Recommendations  Home health PT     Equipment Recommendations  None recommended by PT    Recommendations for Other Services       Precautions / Restrictions Precautions Precautions: Fall;Knee Required Braces or Orthoses: Knee Immobilizer - Left Restrictions Weight Bearing Restrictions: Yes LLE Weight Bearing: Weight bearing as tolerated    Mobility  Bed Mobility Overal bed mobility: Modified Independent                Transfers Overall transfer level: Needs assistance Equipment used: Rolling walker (2 wheeled) Transfers: Sit to/from Stand Sit to Stand: Min guard         General transfer comment: Cues for proper hand placement and to keep RW with him as he backs up to sit.   Ambulation/Gait Ambulation/Gait assistance: Min guard Gait Distance (Feet): 200 Feet Assistive device: Rolling walker (2 wheeled) Gait Pattern/deviations: Step-to pattern;Step-through pattern Gait velocity: decreased   General Gait Details: unequal and varied step length - hyperextension x 1 during gait as he fatigued.   Stairs             Wheelchair Mobility    Modified Rankin (Stroke Patients Only)       Balance Overall balance assessment: Needs assistance Sitting-balance support: No upper extremity supported;Feet supported Sitting balance-Leahy Scale:  Good     Standing balance support: During functional activity;No upper extremity supported Standing balance-Leahy Scale: Fair Standing balance comment: Pt able to stand statically without UE support but relies on RW to ambulate                            Cognition Arousal/Alertness: Awake/alert Behavior During Therapy: WFL for tasks assessed/performed Overall Cognitive Status: Within Functional Limits for tasks assessed                                        Exercises Total Joint Exercises Ankle Circles/Pumps: AROM;Both;10 reps;Supine Quad Sets: Strengthening;Both;10 reps;Supine Heel Slides: AROM;Left;10 reps;Supine Hip ABduction/ADduction: AROM;10 reps;Left;Supine Straight Leg Raises: AROM;10 reps;Left;Supine    General Comments        Pertinent Vitals/Pain Pain Assessment: 0-10 Pain Score: 4  Pain Descriptors / Indicators: Discomfort;Sore Pain Intervention(s): Limited activity within patient's tolerance;Monitored during session;Ice applied    Home Living                      Prior Function            PT Goals (current goals can now be found in the care plan section) Progress towards PT goals: Progressing toward goals    Frequency    BID  PT Plan Current plan remains appropriate    Co-evaluation              AM-PAC PT "6 Clicks" Daily Activity  Outcome Measure  Difficulty turning over in bed (including adjusting bedclothes, sheets and blankets)?: None Difficulty moving from lying on back to sitting on the side of the bed? : None Difficulty sitting down on and standing up from a chair with arms (e.g., wheelchair, bedside commode, etc,.)?: A Little Help needed moving to and from a bed to chair (including a wheelchair)?: A Little Help needed walking in hospital room?: A Little Help needed climbing 3-5 steps with a railing? : A Little 6 Click Score: 20    End of Session Equipment Utilized During Treatment:  Gait belt Activity Tolerance: Patient tolerated treatment well Patient left: in bed;with call bell/phone within reach;with bed alarm set;with family/visitor present Nurse Communication: Mobility status Pain - Right/Left: Left Pain - part of body: Knee     Time: 5110-2111 PT Time Calculation (min) (ACUTE ONLY): 18 min  Charges:  $Gait Training: 8-22 mins                     Chesley Noon, PTA 03/13/18, 2:57 PM

## 2018-03-14 ENCOUNTER — Inpatient Hospital Stay: Payer: Medicare Other

## 2018-03-14 LAB — GLUCOSE, CAPILLARY: Glucose-Capillary: 147 mg/dL — ABNORMAL HIGH (ref 70–99)

## 2018-03-14 NOTE — Care Management Important Message (Signed)
Important Message  Patient Details  Name: Aiman Noe MRN: [id number] Date of Birth: 09-06-1940   Medicare Important Message Given:  Yes    Juliann Pulse A Chandria Rookstool 03/14/2018, 10:24 AM

## 2018-03-14 NOTE — Progress Notes (Addendum)
   Subjective: 2 Days Post-Op Procedure(s) (LRB): COMPUTER ASSISTED TOTAL KNEE ARTHROPLASTY (Left) Patient reports pain as mild.  However patient now complaining of pain to the medial mid left calf region that he does not remember day 1 postop Patient is well, and has had no acute complaints or problems Patient was able to ambulate around the nurses desk yesterday.  Range of motion 0 to 104 degrees Plan is to go Home after hospital stay. no nausea and no vomiting Patient denies any chest pains or shortness of breath. Objective: Vital signs in last 24 hours: Temp:  [97.7 F (36.5 C)-98.4 F (36.9 C)] 97.9 F (36.6 C) (10/25 0351) Pulse Rate:  [57-63] 62 (10/25 0351) Resp:  [13-19] 19 (10/25 0351) BP: (104-124)/(49-62) 124/62 (10/25 0351) SpO2:  [98 %-100 %] 98 % (10/25 0351) well approximated incision Heels are non tender and elevated off the bed using rolled towels Intake/Output from previous day: 10/24 0701 - 10/25 0700 In: 600 [P.O.:600] Out: 1475 [Urine:1375; Drains:100] Intake/Output this shift: No intake/output data recorded.  No results for input(s): HGB in the last 72 hours. No results for input(s): WBC, RBC, HCT, PLT in the last 72 hours. No results for input(s): NA, K, CL, CO2, BUN, CREATININE, GLUCOSE, CALCIUM in the last 72 hours. No results for input(s): LABPT, INR in the last 72 hours.  EXAM General - Patient is Alert, Appropriate and Oriented Extremity - Neurologically intact Neurovascular intact Sensation intact distally Intact pulses distally Dorsiflexion/Plantar flexion intact No cellulitis present Compartment soft Have well localized area of discomfort to the mid medial aspect of the left calf. Dressing - dressing C/D/I Motor Function - intact, moving foot and toes well on exam.    Past Medical History:  Diagnosis Date  . Arthritis   . Chronic kidney disease   . Degenerative arthritis of left knee   . Diabetes mellitus without complication ([place])    . GERD (gastroesophageal reflux disease)   . History of kidney stones   . Hyperlipidemia   . Hypertension   . Hypothyroidism     Assessment/Plan: 2 Days Post-Op Procedure(s) (LRB): COMPUTER ASSISTED TOTAL KNEE ARTHROPLASTY (Left) Active Problems:   S/P total knee arthroplasty  Estimated body mass index is 29.38 kg/m as calculated from the following:   Height as of 02/26/18: 6\' 2"  (1.88 m).   Weight as of this encounter: 103.8 kg. Up with therapy Discharge home with home health  Labs: None DVT Prophylaxis - Lovenox, Foot Pumps and TED hose Weight-Bearing as tolerated to left leg Venous Doppler ultrasound left lower extremity evaluate DVT Please wash operative leg, change dressing apply TED stockings to both legs prior to discharge.  But only after ultrasound is performed Please give the patient 2 extra honeycomb dressings to take home Hemovac discontinued this morning.  Into the drain appeared to be intact.   Jillyn Ledger. [place] 03/14/2018, 7:29 AM

## 2018-03-14 NOTE — Progress Notes (Signed)
Pt A&OX4. In no distress. RN washed operative leg and changed dressing. Discharge instructions explained to pt and wife. Verbalized understanding. IV removed. Wheeled to visitors entrance and assisted into vehicle.

## 2018-03-14 NOTE — Care Management Note (Signed)
Case Management Note  Patient Details  Name: Greg Lam MRN: [id number] Date of Birth: 1941-01-16  Subjective/Objective: POD # 2 TKA. Met with patient and his wife at bedside to discuss discharge planning. His wife will be his caregiver. He has a walker. They prefer [facility] for HHPT. Referral sent. Was going to check price on Lovenox but wife declined. She stated they had no difficulty paying for medication. PCP is Dr. Sabra Heck.               Action/Plan:   Expected Discharge Date:  03/14/18               Expected Discharge Plan:  [place]  In-House Referral:     Discharge planning Services  CM Consult  Post Acute Care Choice:  Home Health Choice offered to:  Patient, Spouse  DME Arranged:    DME Agency:     HH Arranged:  PT [place]:  [facility] (formerly [employer])  Status of Service:  Completed, signed off  If discussed at [employer], dates discussed:    Additional Comments:  Jolly Mango, RN 03/14/2018, 10:06 AM

## 2018-03-14 NOTE — Progress Notes (Signed)
Physical Therapy Treatment Patient Details Name: Greg Lam MRN: [id number] DOB: 1940-10-11 Today's Date: 03/14/2018    History of Present Illness Pt is a 77 y/o M s/p L TKA. PMH: DM, CKD, HTN, hypothyroidism, hyperlipidemia    PT Comments    Patient demonstrated progress with stair negotiation and gait pattern today.   He was able to demonstrate proficiency with toilet transfers and STS from lower surface as well.  PT monitored pt's BP which remained WNL sitting and standing.  He was able to complete there ex, demonstrating understanding of importance of progression of HEP.  Pt demonstrated continually improving AROM knee flexion sufficient for all daily functional mobility.  He was receptive to education regarding polar care, HEP, stair negotiation and car transfers.  He reported no pain increase during treatment today.  Pt will continue to benefit from skilled PT with focus on strength, gait, HEP and knee ROM.  Follow Up Recommendations  Home health PT     Equipment Recommendations  None recommended by PT    Recommendations for Other Services       Precautions / Restrictions Precautions Precautions: Fall;Knee Required Braces or Orthoses: Knee Immobilizer - Left Restrictions Weight Bearing Restrictions: Yes LLE Weight Bearing: Weight bearing as tolerated    Mobility  Bed Mobility Overal bed mobility: Modified Independent             General bed mobility comments: increased time  Transfers Overall transfer level: Needs assistance Equipment used: Rolling walker (2 wheeled) Transfers: Sit to/from Stand Sit to Stand: Supervision         General transfer comment: No cues needed.  Pt able to stand from bed in lowered position.  Ambulation/Gait Ambulation/Gait assistance: Min guard Gait Distance (Feet): 75 Feet Assistive device: Rolling walker (2 wheeled) Gait Pattern/deviations: Step-through pattern   Gait velocity interpretation: 1.31 - 2.62 ft/sec, indicative  of limited community ambulator General Gait Details: Gait more symmetric today, slow but with good use of RW during turns.   Stairs Stairs: Yes Stairs assistance: Min guard Stair Management: Step to pattern;One rail Left Number of Stairs: 4 General stair comments: Pt able to navigate stairs safely with min VC's for lead foot and hand placement.   Wheelchair Mobility    Modified Rankin (Stroke Patients Only)       Balance Overall balance assessment: Needs assistance Sitting-balance support: No upper extremity supported;Feet supported Sitting balance-Leahy Scale: Good     Standing balance support: During functional activity;No upper extremity supported Standing balance-Leahy Scale: Fair Standing balance comment: Pt able to stand at sink without BUE support to brush teeth.                            Cognition Arousal/Alertness: Awake/alert Behavior During Therapy: WFL for tasks assessed/performed Overall Cognitive Status: Within Functional Limits for tasks assessed                                 General Comments: Follows commands consistently.      Exercises Total Joint Exercises Ankle Circles/Pumps: 5 reps;Both;AROM;Supine Quad Sets: Left;Strengthening;10 reps;Supine Knee Flexion: 5 reps;Left;Seated;AROM Goniometric ROM: L knee flexion: 110 degrees AROM Other Exercises Other Exercises: Education provided regarding stair negotiation and home setup.  Pt and wife comfortable with car transfers. x6 min    General Comments        Pertinent Vitals/Pain      Home  Living                      Prior Function            PT Goals (current goals can now be found in the care plan section) Acute Rehab PT Goals Patient Stated Goal: to return to PLOF PT Goal Formulation: With patient Time For Goal Achievement: 03/27/18 Potential to Achieve Goals: Good Progress towards PT goals: Progressing toward goals    Frequency     BID      PT Plan Current plan remains appropriate    Co-evaluation              AM-PAC PT "6 Clicks" Daily Activity  Outcome Measure  Difficulty turning over in bed (including adjusting bedclothes, sheets and blankets)?: None Difficulty moving from lying on back to sitting on the side of the bed? : None Difficulty sitting down on and standing up from a chair with arms (e.g., wheelchair, bedside commode, etc,.)?: A Little Help needed moving to and from a bed to chair (including a wheelchair)?: A Little Help needed walking in hospital room?: A Little Help needed climbing 3-5 steps with a railing? : A Little 6 Click Score: 20    End of Session Equipment Utilized During Treatment: Gait belt Activity Tolerance: Patient tolerated treatment well Patient left: in bed;with call bell/phone within reach;with family/visitor present;with nursing/sitter in room Nurse Communication: Mobility status PT Visit Diagnosis: Muscle weakness (generalized) (M62.81);Unsteadiness on feet (R26.81);Pain Pain - Right/Left: Left Pain - part of body: Knee     Time: 2919-1660 PT Time Calculation (min) (ACUTE ONLY): 24 min  Charges:  $Gait Training: 8-22 mins $Therapeutic Exercise: 8-22 mins                     Roxanne Gates, PT, DPT    Roxanne Gates 03/14/2018, 9:58 AM

## 2018-03-17 NOTE — Anesthesia Postprocedure Evaluation (Signed)
Anesthesia Post Note  Patient: Greg Lam  Procedure(s) Performed: COMPUTER ASSISTED TOTAL KNEE ARTHROPLASTY (Left Knee)  Anesthesia Type: General Comments: Patient discharged prior to anesthesia post-op evaluation      Last Vitals:  Vitals:   03/14/18 0824 03/14/18 0948  BP: (!) 110/53 130/63  Pulse: 71 68  Resp:    Temp: 36.7 C 36.6 C  SpO2: 97% 99%    Last Pain:  Vitals:   03/14/18 0948  TempSrc: Oral  PainSc:                  Alison Stalling

## 2019-03-26 IMAGING — US US EXTREM LOW VENOUS*L*
1 series · 13 of 24 positions shown · non-contrast
Comparison: None.

CLINICAL DATA: 77-year-old with left calf pain and recent total
knee arthroplasty.



[Series 1: us extrem low venous*left* · 13 of 35 slices shown]
[im 1/35]
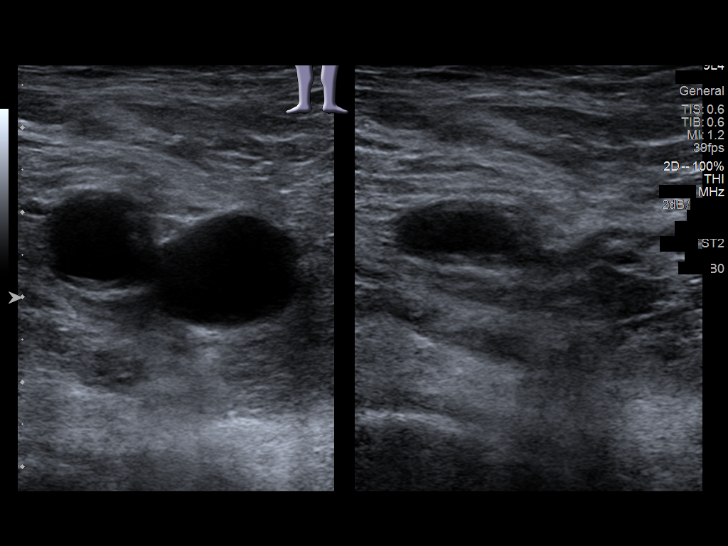
[im 3/35]
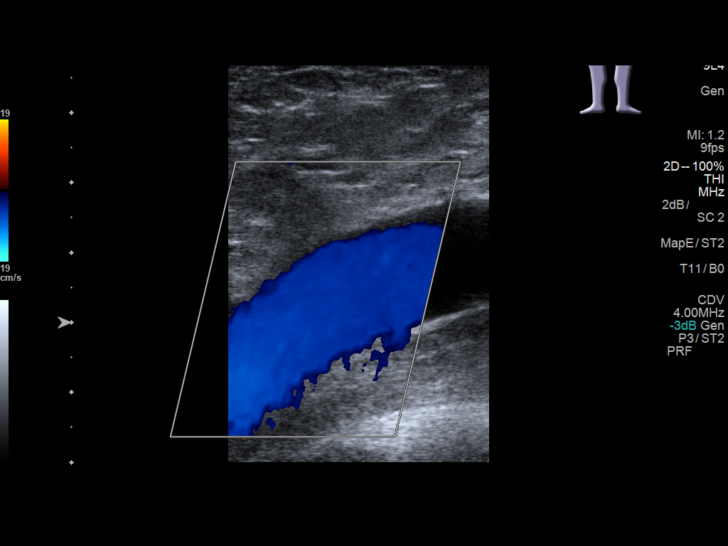
[im 6/35]
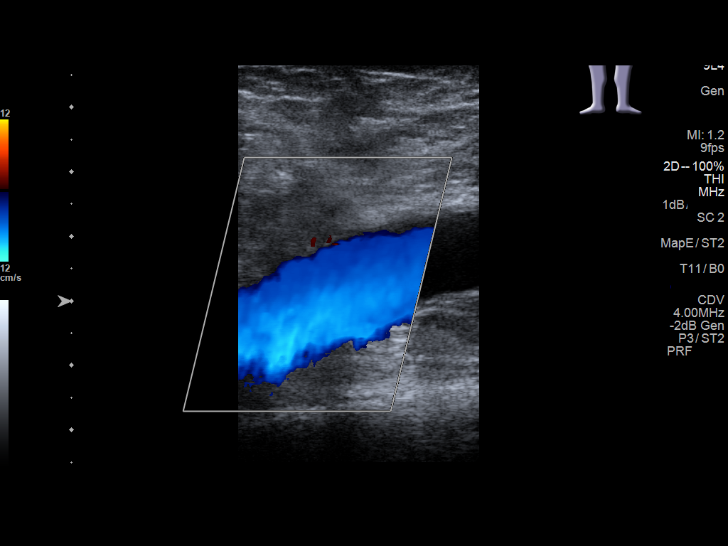
[im 9/35]
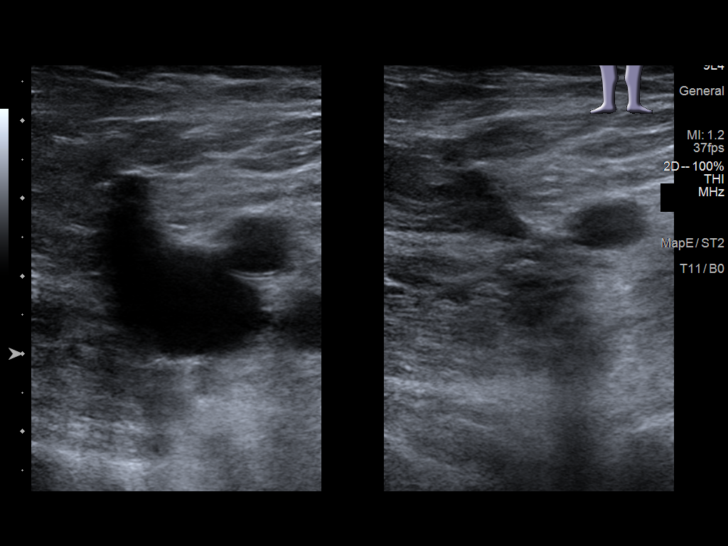
[im 12/35]
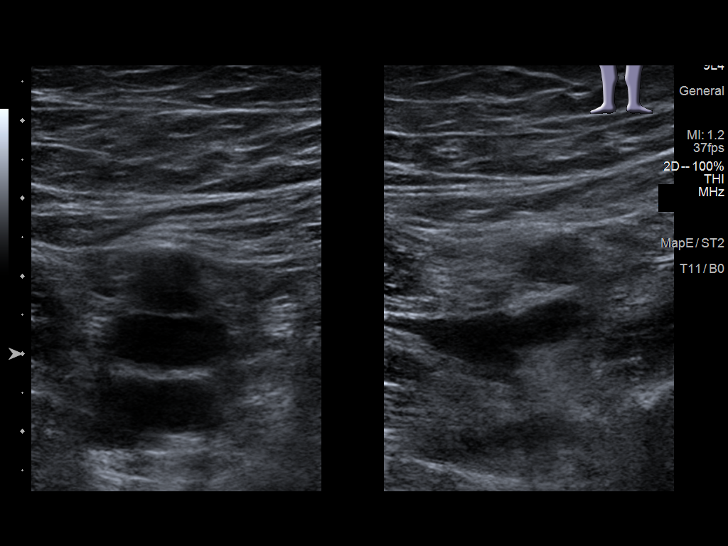
[im 15/35]
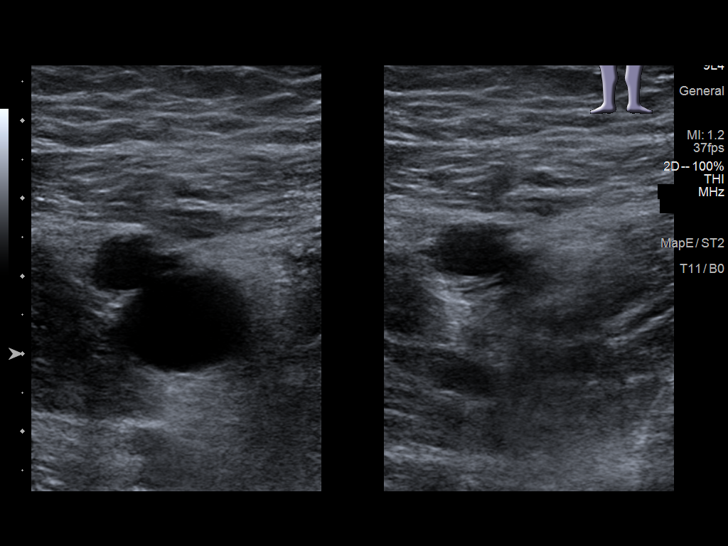
[im 18/35]
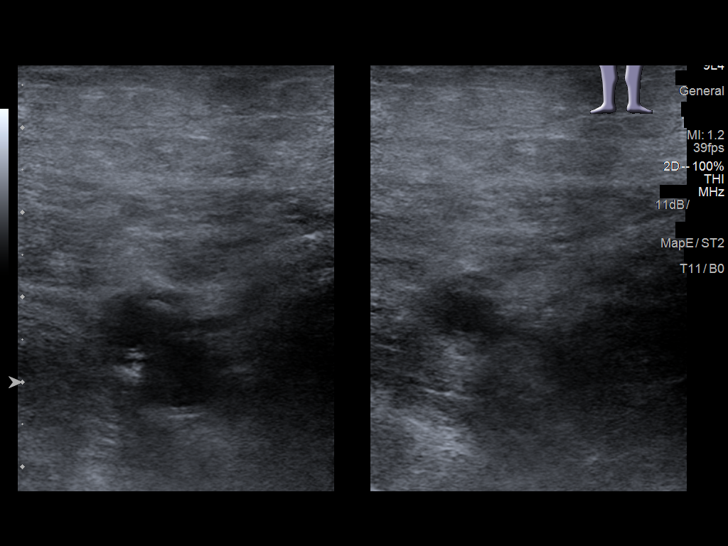
[im 20/35]
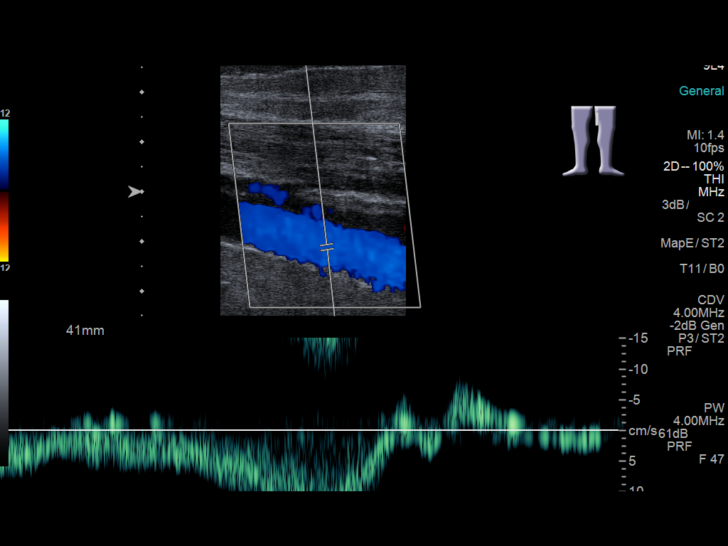
[im 23/35]
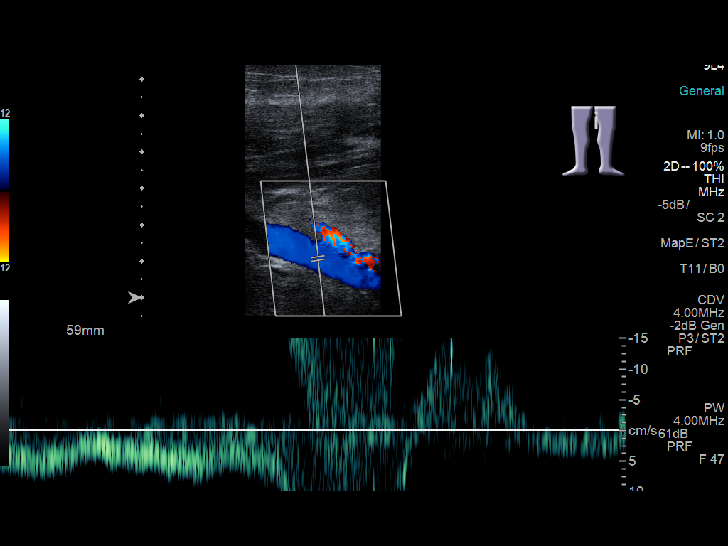
[im 26/35]
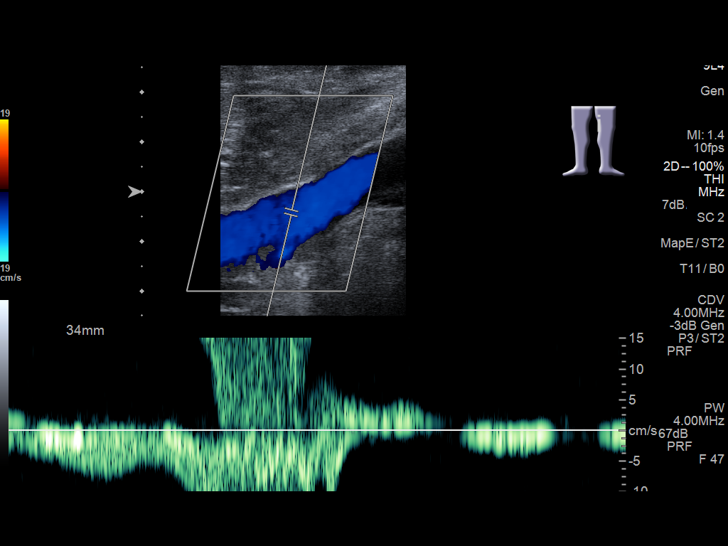
[im 29/35]
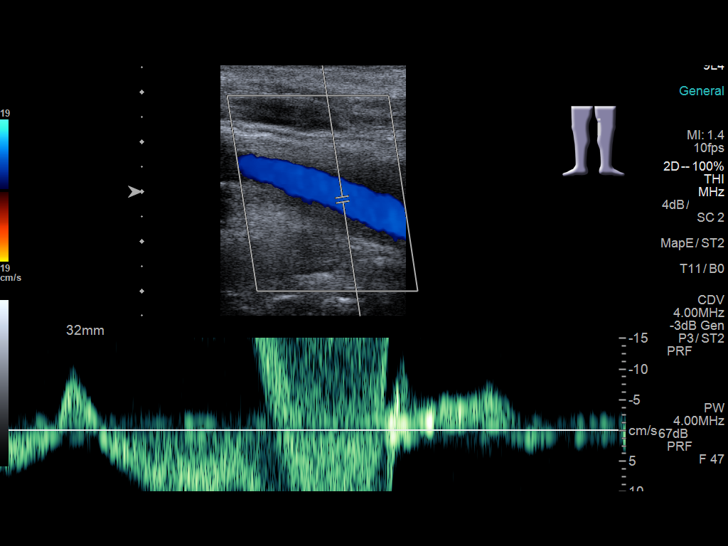
[im 32/35]
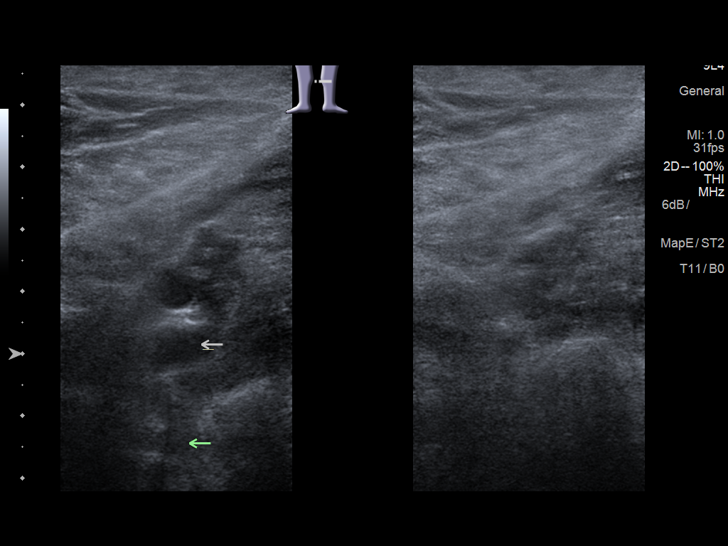
[im 35/35]
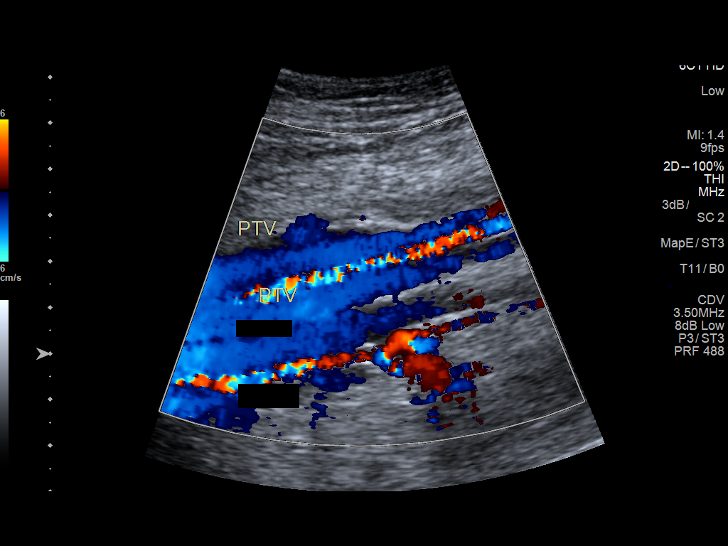

[13 of 24 positions shown; findings below may reference images not displayed]

FINDINGS: Contralateral Common Femoral Vein: Respiratory phasicity is normal
and symmetric with the symptomatic side. No evidence of thrombus.
Normal compressibility.

Common Femoral Vein: No evidence of thrombus. Normal
compressibility, respiratory phasicity and response to augmentation.

Saphenofemoral Junction: No evidence of thrombus. Normal
compressibility and flow on color Doppler imaging.

Profunda Femoral Vein: No evidence of thrombus. Normal
compressibility and flow on color Doppler imaging.

Femoral Vein: No evidence of thrombus. Normal compressibility,
respiratory phasicity and response to augmentation.

Popliteal Vein: No evidence of thrombus. Normal compressibility,
respiratory phasicity and response to augmentation.

Calf Veins: No evidence of thrombus. Normal compressibility and flow
on color Doppler imaging.

Venous Reflux:  None.

Other Findings:  None.
IMPRESSION: Negative for deep venous thrombosis in left lower extremity.

## 2019-04-30 ENCOUNTER — Other Ambulatory Visit: Payer: Self-pay

## 2019-04-30 DIAGNOSIS — Z20822 Contact with and (suspected) exposure to covid-19: Secondary | ICD-10-CM

## 2019-05-02 LAB — NOVEL CORONAVIRUS, NAA: SARS-CoV-2, NAA: NOT DETECTED

## 2019-07-03 ENCOUNTER — Ambulatory Visit: Payer: Medicare Other

## 2020-01-13 ENCOUNTER — Other Ambulatory Visit: Payer: Medicare Other

## 2020-04-13 IMAGING — DX DG KNEE 1-2V PORT*L*
2 series · 2 of 2 positions shown · non-contrast
Comparison: None.

CLINICAL DATA: Left knee replacement

EXAM:
PORTABLE LEFT KNEE - 1-2 VIEW

[knee ap]
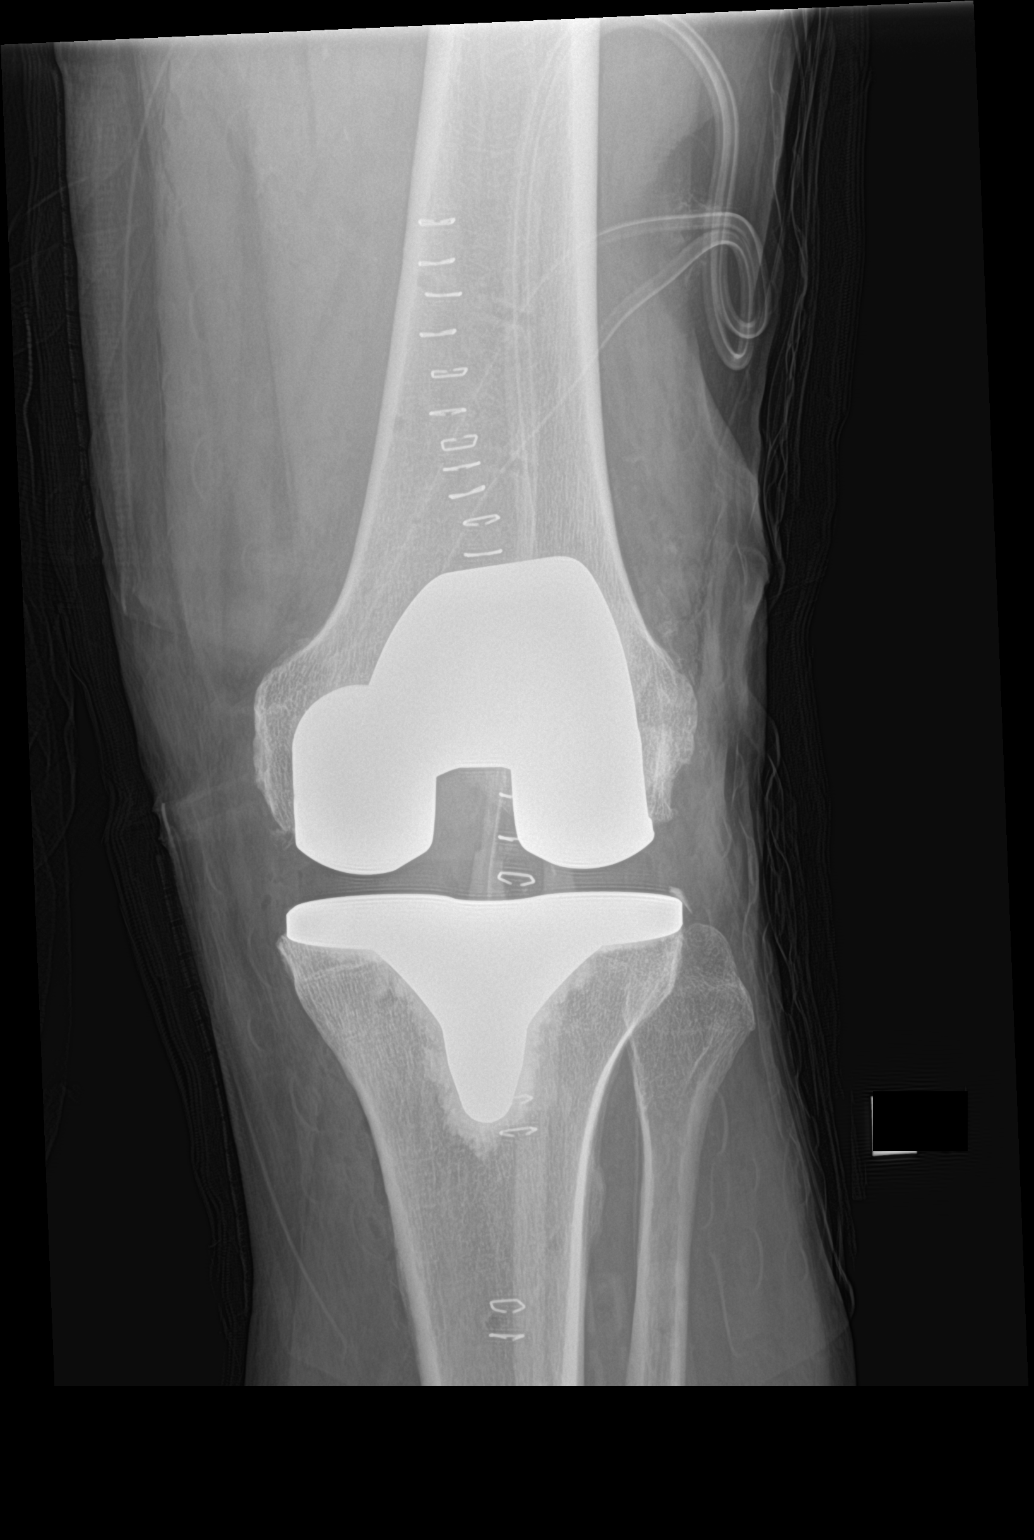

[knee lat]
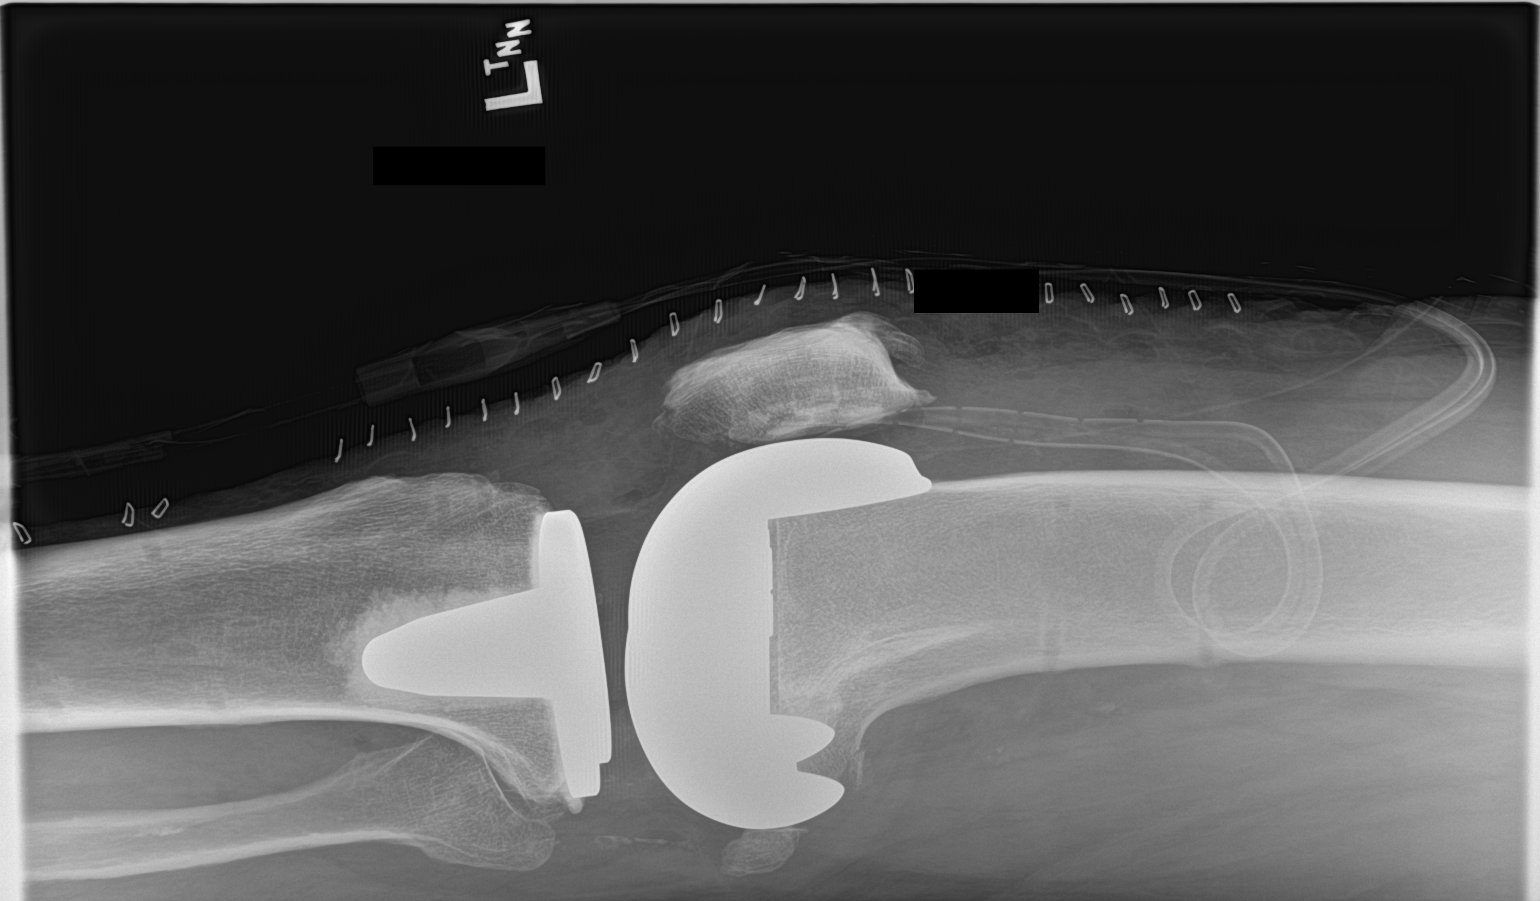

[2 of 2 positions shown; findings below may reference images not displayed]

FINDINGS: Status post left knee replacement. Anatomic alignment. Hardware
appears intact. No complicating feature. Anterior staples noted.
Surgical drains present. Expected soft tissue edema. Peripheral
atherosclerosis noted.
IMPRESSION: Expected appearance and alignment status post left knee arthroplasty

## 2020-09-26 ENCOUNTER — Other Ambulatory Visit: Admission: RE | Admit: 2020-09-26 | Payer: Medicare Other | Source: Ambulatory Visit

## 2020-09-27 ENCOUNTER — Encounter: Payer: Self-pay | Admitting: Internal Medicine

## 2020-09-28 ENCOUNTER — Encounter
Admission: RE | Disposition: A | Discharge status: Home / Self Care | Payer: Self-pay | Source: Home / Self Care | Attending: Internal Medicine

## 2020-09-28 ENCOUNTER — Other Ambulatory Visit: Payer: Self-pay

## 2020-09-28 ENCOUNTER — Ambulatory Visit
Admission: RE | Admit: 2020-09-28 | Discharge: 2020-09-28 | Disposition: A | Discharge status: Home / Self Care | Payer: Medicare Other | Attending: Internal Medicine | Admitting: Internal Medicine

## 2020-09-28 ENCOUNTER — Encounter: Payer: Self-pay | Admitting: Internal Medicine

## 2020-09-28 ENCOUNTER — Ambulatory Visit: Payer: Medicare Other | Admitting: Certified Registered"

## 2020-09-28 DIAGNOSIS — Z79899 Other long term (current) drug therapy: Secondary | ICD-10-CM | POA: Insufficient documentation

## 2020-09-28 DIAGNOSIS — K2100 Gastro-esophageal reflux disease with esophagitis, without bleeding: Secondary | ICD-10-CM | POA: Insufficient documentation

## 2020-09-28 DIAGNOSIS — K449 Diaphragmatic hernia without obstruction or gangrene: Secondary | ICD-10-CM | POA: Diagnosis not present

## 2020-09-28 DIAGNOSIS — K5730 Diverticulosis of large intestine without perforation or abscess without bleeding: Secondary | ICD-10-CM | POA: Insufficient documentation

## 2020-09-28 DIAGNOSIS — Z888 Allergy status to other drugs, medicaments and biological substances status: Secondary | ICD-10-CM | POA: Diagnosis not present

## 2020-09-28 DIAGNOSIS — K640 First degree hemorrhoids: Secondary | ICD-10-CM | POA: Diagnosis not present

## 2020-09-28 DIAGNOSIS — I129 Hypertensive chronic kidney disease with stage 1 through stage 4 chronic kidney disease, or unspecified chronic kidney disease: Secondary | ICD-10-CM | POA: Diagnosis not present

## 2020-09-28 DIAGNOSIS — Z7901 Long term (current) use of anticoagulants: Secondary | ICD-10-CM | POA: Insufficient documentation

## 2020-09-28 DIAGNOSIS — Z885 Allergy status to narcotic agent status: Secondary | ICD-10-CM | POA: Insufficient documentation

## 2020-09-28 DIAGNOSIS — E1122 Type 2 diabetes mellitus with diabetic chronic kidney disease: Secondary | ICD-10-CM | POA: Insufficient documentation

## 2020-09-28 DIAGNOSIS — Z1211 Encounter for screening for malignant neoplasm of colon: Secondary | ICD-10-CM | POA: Insufficient documentation

## 2020-09-28 DIAGNOSIS — N189 Chronic kidney disease, unspecified: Secondary | ICD-10-CM | POA: Insufficient documentation

## 2020-09-28 DIAGNOSIS — E039 Hypothyroidism, unspecified: Secondary | ICD-10-CM | POA: Diagnosis not present

## 2020-09-28 DIAGNOSIS — R1314 Dysphagia, pharyngoesophageal phase: Secondary | ICD-10-CM | POA: Diagnosis not present

## 2020-09-28 DIAGNOSIS — K3189 Other diseases of stomach and duodenum: Secondary | ICD-10-CM | POA: Diagnosis not present

## 2020-09-28 DIAGNOSIS — Z79890 Hormone replacement therapy (postmenopausal): Secondary | ICD-10-CM | POA: Insufficient documentation

## 2020-09-28 DIAGNOSIS — D125 Benign neoplasm of sigmoid colon: Secondary | ICD-10-CM | POA: Diagnosis not present

## 2020-09-28 DIAGNOSIS — Z7984 Long term (current) use of oral hypoglycemic drugs: Secondary | ICD-10-CM | POA: Diagnosis not present

## 2020-09-28 DIAGNOSIS — D120 Benign neoplasm of cecum: Secondary | ICD-10-CM | POA: Insufficient documentation

## 2020-09-28 HISTORY — DX: Unspecified osteoarthritis, unspecified site: M19.90

## 2020-09-28 HISTORY — PX: COLONOSCOPY WITH PROPOFOL: SHX5780

## 2020-09-28 HISTORY — PX: ESOPHAGOGASTRODUODENOSCOPY (EGD) WITH PROPOFOL: SHX5813

## 2020-09-28 SURGERY — COLONOSCOPY WITH PROPOFOL
Anesthesia: General

## 2020-09-28 MED ORDER — LIDOCAINE HCL (PF) 2 % IJ SOLN
INTRAMUSCULAR | Status: AC
  Filled 2020-09-28: qty 2

## 2020-09-28 MED ORDER — SODIUM CHLORIDE 0.9 % IV SOLN
INTRAVENOUS | Status: DC
  Administered 2020-09-28: 1000 mL via INTRAVENOUS

## 2020-09-28 MED ORDER — PROPOFOL 10 MG/ML IV BOLUS
INTRAVENOUS | Status: AC
  Filled 2020-09-28: qty 20

## 2020-09-28 MED ORDER — PROPOFOL 10 MG/ML IV BOLUS
INTRAVENOUS | Status: DC | PRN
  Administered 2020-09-28 (×2): 20 mg via INTRAVENOUS
  Administered 2020-09-28: 70 mg via INTRAVENOUS
  Administered 2020-09-28: 30 mg via INTRAVENOUS
  Administered 2020-09-28: 40 mg via INTRAVENOUS
  Administered 2020-09-28: 20 mg via INTRAVENOUS

## 2020-09-28 MED ORDER — LIDOCAINE HCL (CARDIAC) PF 100 MG/5ML IV SOSY
PREFILLED_SYRINGE | INTRAVENOUS | Status: DC | PRN
  Administered 2020-09-28: 40 mg via INTRAVENOUS

## 2020-09-28 NOTE — Interval H&P Note (Signed)
History and Physical Interval Note:  09/28/2020 8:46 AM  Greg Lam  has presented today for surgery, with the diagnosis of SCREENING GERD DYSPHAGIA.  The various methods of treatment have been discussed with the patient and family. After consideration of risks, benefits and other options for treatment, the patient has consented to  Procedure(s) with comments: COLONOSCOPY WITH PROPOFOL (N/A) - DM ESOPHAGOGASTRODUODENOSCOPY (EGD) WITH PROPOFOL (N/A) as a surgical intervention.  The patient's history has been reviewed, patient examined, no change in status, stable for surgery.  I have reviewed the patient's chart and labs.  Questions were answered to the patient's satisfaction.     [name], [place]

## 2020-09-28 NOTE — Transfer of Care (Signed)
Immediate Anesthesia Transfer of Care Note  Patient: Greg Lam  Procedure(s) Performed: COLONOSCOPY WITH PROPOFOL (N/A ) ESOPHAGOGASTRODUODENOSCOPY (EGD) WITH PROPOFOL (N/A )  Patient Location: PACU and Endoscopy Unit  Anesthesia Type:General  Level of Consciousness: awake, alert  and oriented  Airway & Oxygen Therapy: Patient Spontanous Breathing  Post-op Assessment: Report given to RN and Post -op Vital signs reviewed and stable  Post vital signs: Reviewed and stable  Last Vitals:  Vitals Value Taken Time  BP 130/77 09/28/20 0928  Temp    Pulse 41 09/28/20 0928  Resp 10 09/28/20 0928  SpO2 97 % 09/28/20 0928  Vitals shown include unvalidated device data.  Last Pain:  Vitals:   09/28/20 0835  TempSrc: Temporal  PainSc: 0-No pain         Complications: No complications documented.

## 2020-09-28 NOTE — H&P (Signed)
Outpatient short stay form Pre-procedure 09/28/2020 8:44 AM Greg Lam K. [name], M.D.  Primary Physician: Emily Filbert, M.D.  Reason for visit: GERD, esophageal dysphagia, Colon cancer screening  History of present illness:  Patient is a pleasant 80 y/o male s/p nissen fundoplication without significant GERD symptoms but c/o recent onset of esophageal dysphagia to solids, relatively infrequent. Denies hemetemesis, weight loss, abdominal pain. Patient presents for colonoscopy for colon cancer screening. The patient denies complaints of abdominal pain, significant change in bowel habits, or rectal bleeding.      Current Facility-Administered Medications:  .  0.9 %  sodium chloride infusion, , Intravenous, Continuous, Matthan Sledge, Benay Pike, MD  Medications Prior to Admission  Medication Sig Dispense Refill Last Dose  . amLODipine (NORVASC) 10 MG tablet Take 10 mg by mouth at bedtime.   09/27/2020 at Unknown time  . atenolol (TENORMIN) 50 MG tablet Take 50 mg by mouth daily.   09/28/2020 at 0730  . Cholecalciferol (VITAMIN D3) 2000 units TABS Take 1 tablet by mouth daily.   09/27/2020 at Unknown time  . clobetasol cream (TEMOVATE) 2.44 % Apply 1 application topically 2 (two) times daily.   09/27/2020 at Unknown time  . Cyanocobalamin (B-12) 1000 MCG LOZG Take 1 tablet by mouth daily as needed.   09/27/2020 at Unknown time  . famotidine (PEPCID) 20 MG tablet Take 20 mg by mouth 2 (two) times daily.   09/27/2020 at Unknown time  . gabapentin (NEURONTIN) 100 MG capsule Take 100 mg by mouth at bedtime.   09/27/2020 at Unknown time  . glimepiride (AMARYL) 2 MG tablet Take 3 mg by mouth daily. Takes 1 tablet in the morning and 1/2 tablet in the evening   09/27/2020 at Unknown time  . hydrochlorothiazide (HYDRODIURIL) 25 MG tablet Take 25 mg by mouth daily.   09/27/2020 at Unknown time  . levothyroxine (SYNTHROID, LEVOTHROID) 175 MCG tablet Take 175 mcg by mouth daily before breakfast.   09/28/2020 at 0730  .  ranitidine (ZANTAC) 150 MG tablet Take 150 mg by mouth 2 (two) times daily.   09/27/2020 at Unknown time  . simvastatin (ZOCOR) 10 MG tablet Take 10 mg by mouth at bedtime.   09/27/2020 at Unknown time  . sulfaSALAzine (AZULFIDINE) 500 MG tablet Take 500 mg by mouth 2 (two) times daily.   09/27/2020 at Unknown time  . telmisartan (MICARDIS) 80 MG tablet Take 80 mg by mouth daily.   09/28/2020 at 0730  . traMADol (ULTRAM) 50 MG tablet Take 1-2 tablets (50-100 mg total) by mouth every 4 (four) hours as needed for moderate pain. 60 tablet 0 09/27/2020 at Unknown time  . traZODone (DESYREL) 50 MG tablet Take 100 mg by mouth at bedtime.   09/27/2020 at Unknown time  . amoxicillin (AMOXIL) 500 MG capsule Take 2,000 mg by mouth once. 1 hour prior to dental appointment     . enoxaparin (LOVENOX) 40 MG/0.4ML injection Inject 0.4 mLs (40 mg total) into the skin daily for 14 days. 14 Syringe 0   . losartan-hydrochlorothiazide (HYZAAR) 100-25 MG tablet Take 1 tablet by mouth daily. (Patient not taking: Reported on 09/28/2020)   Not Taking at Unknown time  . oxyCODONE (OXY IR/ROXICODONE) 5 MG immediate release tablet Take 1 tablet (5 mg total) by mouth every 4 (four) hours as needed for moderate pain (pain score 4-6). (Patient not taking: Reported on 09/28/2020) 60 tablet 0 Not Taking at Unknown time  . traMADol (ULTRAM) 50 MG tablet Take by mouth 2 (two) times  daily as needed.        Allergies  Allergen Reactions  . Codeine Sulfate [Codeine] Shortness Of Breath  . Propoxyphene Hives and Shortness Of Breath     Past Medical History:  Diagnosis Date  . Arthritis   . Chronic kidney disease   . Degenerative arthritis of left knee   . Diabetes mellitus without complication ([place])   . GERD (gastroesophageal reflux disease)   . History of kidney stones   . Hyperlipidemia   . Hypertension   . Hypothyroidism   . Osteoarthritis     Review of systems:  Otherwise negative.    Physical Exam  Gen: Alert,  oriented. Appears stated age.  HEENT: [place]/AT. PERRLA. Lungs: CTA, no wheezes. CV: RR nl S1, S2. Abd: soft, benign, no masses. BS+ Ext: No edema. Pulses 2+    Planned procedures: Proceed with EGD and colonoscopy. The patient understands the nature of the planned procedure, indications, risks, alternatives and potential complications including but not limited to bleeding, infection, perforation, damage to internal organs and possible oversedation/side effects from anesthesia. The patient agrees and gives consent to proceed.  Please refer to procedure notes for findings, recommendations and patient disposition/instructions.     Greg Lam K. [name], M.D. Gastroenterology 09/28/2020  8:44 AM

## 2020-09-28 NOTE — Anesthesia Preprocedure Evaluation (Signed)
Anesthesia Evaluation  Patient identified by MRN, date of birth, ID band Patient awake    Reviewed: Allergy & Precautions, NPO status , Patient's Chart, lab work & pertinent test results, reviewed documented beta blocker date and time   History of Anesthesia Complications Negative for: history of anesthetic complications  Airway Mallampati: III  TM Distance: >3 FB Neck ROM: Full    Dental no notable dental hx.    Pulmonary neg sleep apnea, neg COPD, Not current smoker, former smoker,    Pulmonary exam normal        Cardiovascular hypertension, Pt. on medications and Pt. on home beta blockers (-) Past MI and (-) CHF Normal cardiovascular exam(-) dysrhythmias (-) Valvular Problems/Murmurs     Neuro/Psych neg Seizures    GI/Hepatic Neg liver ROS, Bowel prep,GERD  Medicated and Poorly Controlled,  Endo/Other  diabetes, Well Controlled, Type 2, Oral Hypoglycemic AgentsHypothyroidism   Renal/GU Renal InsufficiencyRenal disease     Musculoskeletal  (+) Arthritis , Osteoarthritis,    Abdominal   Peds  Hematology   Anesthesia Other Findings . Arthritis  . Chronic kidney disease  . Diabetes mellitus type 2, uncomplicated (CMS-HCC)  adult-onset  . DM (diabetes mellitus) (CMS-HCC) 12/09/2013  . HTN (hypertension) 12/09/2013  . Hyperlipidemia  . Hypothyroid, unspecified 12/09/2013  . Hypothyroidism  . Osteoarthritis    Reproductive/Obstetrics                             Anesthesia Physical  Anesthesia Plan  ASA: III  Anesthesia Plan: General   Post-op Pain Management:    Induction: Intravenous  PONV Risk Score and Plan: 2 and Propofol infusion and TIVA  Airway Management Planned: Nasal Cannula and Natural Airway  Additional Equipment:   Intra-op Plan:   Post-operative Plan:   Informed Consent: I have reviewed the patients History and Physical, chart, labs and discussed the procedure  including the risks, benefits and alternatives for the proposed anesthesia with the patient or authorized representative who has indicated his/her understanding and acceptance.       Plan Discussed with: CRNA, Anesthesiologist and Surgeon  Anesthesia Plan Comments:         Anesthesia Quick Evaluation

## 2020-09-28 NOTE — Op Note (Signed)
[facility] Gastroenterology Patient Name: Greg Lam Procedure Date: 09/28/2020 8:47 AM MRN: [id number] Account #: [id number] Date of Birth: 01-Dec-1940 Admit Type: Outpatient Age: 80 Room: [facility] ENDO ROOM 2 Gender: Male Note Status: Finalized Procedure:             Colonoscopy Indications:           Screening for colorectal malignant neoplasm Providers:             Benay Pike. [name] MD, MD Referring MD:          Rusty Aus, MD (Referring MD) Medicines:             Propofol per Anesthesia Complications:         No immediate complications. Procedure:             Pre-Anesthesia Assessment:                        - The risks and benefits of the procedure and the                         sedation options and risks were discussed with the                         patient. All questions were answered and informed                         consent was obtained.                        - Patient identification and proposed procedure were                         verified prior to the procedure by the nurse. The                         procedure was verified in the procedure room.                        - ASA Grade Assessment: III - A patient with severe                         systemic disease.                        - After reviewing the risks and benefits, the patient                         was deemed in satisfactory condition to undergo the                         procedure.                        After obtaining informed consent, the colonoscope was                         passed under direct vision. Throughout the procedure,                         the patient's blood pressure,  pulse, and oxygen                         saturations were monitored continuously. The                         Colonoscope was introduced through the anus and                         advanced to the the cecum, identified by appendiceal                         orifice and ileocecal valve. The  colonoscopy was                         performed without difficulty. The patient tolerated                         the procedure well. The quality of the bowel                         preparation was good. The ileocecal valve, appendiceal                         orifice, and rectum were photographed. Findings:      The perianal and digital rectal examinations were normal. Pertinent       negatives include normal sphincter tone and no palpable rectal lesions.      Non-bleeding internal hemorrhoids were found during retroflexion. The       hemorrhoids were Grade I (internal hemorrhoids that do not prolapse).      Many small and large-mouthed diverticula were found in the sigmoid       colon. There was no evidence of diverticular bleeding.      A 8 mm polyp was found in the sigmoid colon. The polyp was       semi-pedunculated. The polyp was removed with a hot snare. Resection and       retrieval were complete.      A 5 mm polyp was found in the distal sigmoid colon. The polyp was       sessile. The polyp was removed with a jumbo cold forceps. Resection and       retrieval were complete.      A 3 mm polyp was found in the ileocecal valve. The polyp was sessile.       The polyp was removed with a jumbo cold forceps. Resection and retrieval       were complete.      The exam was otherwise without abnormality. Impression:            - Non-bleeding internal hemorrhoids.                        - Mild diverticulosis in the sigmoid colon. There was                         no evidence of diverticular bleeding.                        - One 8 mm polyp in the sigmoid colon, removed with a  hot snare. Resected and retrieved.                        - One 5 mm polyp in the distal sigmoid colon, removed                         with a jumbo cold forceps. Resected and retrieved.                        - One 3 mm polyp at the ileocecal valve, removed with                         a  jumbo cold forceps. Resected and retrieved.                        - The examination was otherwise normal. Recommendation:        - Patient has a contact number available for                         emergencies. The signs and symptoms of potential                         delayed complications were discussed with the patient.                         Return to normal activities tomorrow. Written                         discharge instructions were provided to the patient.                        - Resume previous diet.                        - Continue present medications.                        - Await pathology results.                        - If polyps are benign or adenomatous without                         dysplasia, I will advise NO further colonoscopy due to                         advanced age and/or severe comorbidity.                        - You do NOT require further colon cancer screening                         measures (Annual stool testing (i.e. hemoccult, FIT,                         cologuard), sigmoidoscopy, colonoscopy or CT                         colonography). You should share this  recommendation                         with your Primary Care provider.                        - Consider change in acid suppression therapy vs.                         repeat surgery for hiatal hernia and GERD depending                         upon surgical risk/patient preferences.                        - Follow up with Octavia Bruckner, PA-C in 3 months.                        - The findings and recommendations were discussed with                         the patient. Procedure Code(s):     --- Professional ---                        [phone], Colonoscopy, flexible; with removal of                         tumor(s), polyp(s), or other lesion(s) by snare                         technique                        45380, 38, Colonoscopy, flexible; with biopsy, single                         or  multiple Diagnosis Code(s):     --- Professional ---                        K57.30, Diverticulosis of large intestine without                         perforation or abscess without bleeding                        K63.5, Polyp of colon                        K64.0, First degree hemorrhoids                        Z12.11, Encounter for screening for malignant neoplasm                         of colon CPT copyright 2019 [employer]. All rights reserved. The codes documented in this report are preliminary and upon coder review may  be revised to meet current compliance requirements. Efrain Sella MD, MD 09/28/2020 9:30:39 AM This report has been signed electronically. Number of Addenda: 0 Note Initiated On: 09/28/2020 8:47 AM Scope Withdrawal Time: 0 hours 5 minutes  24 seconds  Total Procedure Duration: 0 hours 11 minutes 6 seconds  Estimated Blood Loss:  Estimated blood loss: none.      [facility]

## 2020-09-28 NOTE — Op Note (Signed)
[facility] Patient Name: Greg Lam Procedure Date: 09/28/2020 8:48 AM MRN: [id number] Account #: [id number] Date of Birth: Sep 28, 1940 Admit Type: Outpatient Age: 80 Room: [facility] ENDO ROOM 2 Gender: Male Note Status: Finalized Procedure:             Upper GI endoscopy Indications:           Dysphagia, Gastro-esophageal reflux disease,                         Assessment following Nissen fundoplication Providers:             Benay Pike. [name] MD, MD Referring MD:          Rusty Aus, MD (Referring MD) Medicines:             Propofol per Anesthesia Complications:         No immediate complications. Procedure:             Pre-Anesthesia Assessment:                        - The risks and benefits of the procedure and the                         sedation options and risks were discussed with the                         patient. All questions were answered and informed                         consent was obtained.                        - Patient identification and proposed procedure were                         verified prior to the procedure by the nurse. The                         procedure was verified in the procedure room.                        - ASA Grade Assessment: III - A patient with severe                         systemic disease.                        - After reviewing the risks and benefits, the patient                         was deemed in satisfactory condition to undergo the                         procedure.                        After obtaining informed consent, the endoscope was                         passed under direct vision. Throughout the  procedure,                         the patient's blood pressure, pulse, and oxygen                         saturations were monitored continuously. The Endoscope                         was introduced through the mouth, and advanced to the                         third part of  duodenum. The upper GI endoscopy was                         accomplished without difficulty. The patient tolerated                         the procedure well. Findings:      The examined esophagus was normal.      There is no endoscopic evidence of stenosis, stricture, signs of       external compression or mass in the distal esophagus.      A 3 cm hiatal hernia was present.      Multiple dispersed small erosions with no bleeding and no stigmata of       recent bleeding were found in the cardia and in the prepyloric region of       the stomach.      The examined duodenum was normal.      The exam was otherwise without abnormality. Impression:            - Normal esophagus.                        - 3 cm hiatal hernia.                        - Erosive gastropathy with no bleeding and no stigmata                         of recent bleeding.                        - Normal examined duodenum.                        - The examination was otherwise normal.                        - No specimens collected. Recommendation:        - Proceed with colonoscopy Procedure Code(s):     --- Professional ---                        [phone], Esophagogastroduodenoscopy, flexible,                         transoral; diagnostic, including collection of                         specimen(s) by brushing or washing, when performed                         (  separate procedure) Diagnosis Code(s):     --- Professional ---                        [phone], Other specified postprocedural states                        Z09, Encounter for follow-up examination after                         completed treatment for conditions other than                         malignant neoplasm                        K21.9, Gastro-esophageal reflux disease without                         esophagitis                        R13.10, Dysphagia, unspecified                        K31.89, Other diseases of stomach and duodenum                         K44.9, Diaphragmatic hernia without obstruction or                         gangrene CPT copyright 2019 [employer]. All rights reserved. The codes documented in this report are preliminary and upon coder review may  be revised to meet current compliance requirements. Efrain Sella MD, MD 09/28/2020 9:11:24 AM This report has been signed electronically. Number of Addenda: 0 Note Initiated On: 09/28/2020 8:48 AM Estimated Blood Loss:  Estimated blood loss: none.      [facility]

## 2020-09-28 NOTE — Anesthesia Postprocedure Evaluation (Signed)
Anesthesia Post Note  Patient: Greg Lam  Procedure(s) Performed: COLONOSCOPY WITH PROPOFOL (N/A ) ESOPHAGOGASTRODUODENOSCOPY (EGD) WITH PROPOFOL (N/A )  Patient location during evaluation: Phase II Anesthesia Type: General Level of consciousness: awake and alert, awake and oriented Pain management: pain level controlled Vital Signs Assessment: post-procedure vital signs reviewed and stable Respiratory status: spontaneous breathing, nonlabored ventilation and respiratory function stable Cardiovascular status: blood pressure returned to baseline and stable Postop Assessment: no apparent nausea or vomiting Anesthetic complications: no   No complications documented.   Last Vitals:  Vitals:   09/28/20 0928 09/28/20 0948  BP: 130/77 (!) 150/83  Pulse:    Resp:    Temp: (!) 36.3 C   SpO2:      Last Pain:  Vitals:   09/28/20 0948  TempSrc:   PainSc: 0-No pain                 Phill Mutter

## 2020-09-29 LAB — SURGICAL PATHOLOGY

## 2024-06-17 ENCOUNTER — Other Ambulatory Visit: Payer: Self-pay | Admitting: Internal Medicine

## 2024-06-17 DIAGNOSIS — F0390 Unspecified dementia without behavioral disturbance: Secondary | ICD-10-CM

## 2024-07-02 ENCOUNTER — Ambulatory Visit
